# Patient Record
Sex: Female | Born: 1950
Health system: Southern US, Community
[De-identification: ages and names within clinical notes are randomized; demographics above are authoritative.]

## PROBLEM LIST (undated history)

## (undated) DIAGNOSIS — J309 Allergic rhinitis, unspecified: Secondary | ICD-10-CM

## (undated) DIAGNOSIS — J45909 Unspecified asthma, uncomplicated: Secondary | ICD-10-CM

## (undated) DIAGNOSIS — I10 Essential (primary) hypertension: Secondary | ICD-10-CM

## (undated) DIAGNOSIS — E119 Type 2 diabetes mellitus without complications: Secondary | ICD-10-CM

## (undated) DIAGNOSIS — M48061 Spinal stenosis, lumbar region without neurogenic claudication: Secondary | ICD-10-CM

## (undated) DIAGNOSIS — D649 Anemia, unspecified: Secondary | ICD-10-CM

## (undated) DIAGNOSIS — R51 Headache: Secondary | ICD-10-CM

## (undated) DIAGNOSIS — E785 Hyperlipidemia, unspecified: Secondary | ICD-10-CM

## (undated) DIAGNOSIS — D496 Neoplasm of unspecified behavior of brain: Secondary | ICD-10-CM

## (undated) DIAGNOSIS — R112 Nausea with vomiting, unspecified: Secondary | ICD-10-CM

## (undated) DIAGNOSIS — IMO0001 Reserved for inherently not codable concepts without codable children: Secondary | ICD-10-CM

## (undated) DIAGNOSIS — R011 Cardiac murmur, unspecified: Secondary | ICD-10-CM

## (undated) DIAGNOSIS — M1990 Unspecified osteoarthritis, unspecified site: Secondary | ICD-10-CM

## (undated) DIAGNOSIS — K2970 Gastritis, unspecified, without bleeding: Secondary | ICD-10-CM

## (undated) DIAGNOSIS — G039 Meningitis, unspecified: Secondary | ICD-10-CM

## (undated) DIAGNOSIS — K219 Gastro-esophageal reflux disease without esophagitis: Secondary | ICD-10-CM

## (undated) DIAGNOSIS — J40 Bronchitis, not specified as acute or chronic: Secondary | ICD-10-CM

## (undated) DIAGNOSIS — I341 Nonrheumatic mitral (valve) prolapse: Secondary | ICD-10-CM

## (undated) DIAGNOSIS — N3281 Overactive bladder: Secondary | ICD-10-CM

## (undated) DIAGNOSIS — H269 Unspecified cataract: Secondary | ICD-10-CM

## (undated) DIAGNOSIS — Z9889 Other specified postprocedural states: Secondary | ICD-10-CM

## (undated) DIAGNOSIS — T7840XA Allergy, unspecified, initial encounter: Secondary | ICD-10-CM

## (undated) DIAGNOSIS — M8580 Other specified disorders of bone density and structure, unspecified site: Secondary | ICD-10-CM

## (undated) DIAGNOSIS — S0990XA Unspecified injury of head, initial encounter: Secondary | ICD-10-CM

## (undated) HISTORY — DX: Anemia, unspecified: D64.9

## (undated) HISTORY — PX: LYMPH NODE DISSECTION: SHX5087

## (undated) HISTORY — DX: Allergic rhinitis, unspecified: J30.9

## (undated) HISTORY — DX: Meningitis, unspecified: G03.9

## (undated) HISTORY — DX: Allergy, unspecified, initial encounter: T78.40XA

## (undated) HISTORY — PX: UPPER GASTROINTESTINAL ENDOSCOPY: SHX188

## (undated) HISTORY — DX: Other specified disorders of bone density and structure, unspecified site: M85.80

## (undated) HISTORY — DX: Unspecified cataract: H26.9

## (undated) HISTORY — DX: Unspecified osteoarthritis, unspecified site: M19.90

## (undated) HISTORY — PX: INSERT INTRAPERITONEAL CATHETER: SUR716

## (undated) HISTORY — DX: Unspecified asthma, uncomplicated: J45.909

## (undated) HISTORY — DX: Overactive bladder: N32.81

---
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
## 1974-02-08 HISTORY — PX: APPENDECTOMY: SHX54

## 1976-02-09 HISTORY — PX: ANKLE SURGERY: SHX546

## 1982-02-08 HISTORY — PX: LUMBAR PERITONEAL SHUNT: SHX1984

## 1983-02-09 HISTORY — PX: MYOMECTOMY ABDOMINAL APPROACH: SUR870

## 1987-02-09 HISTORY — PX: ABDOMINAL HYSTERECTOMY: SHX81

## 1990-02-08 HISTORY — PX: SYNOVECTOMY: SHX133

## 1991-02-09 DIAGNOSIS — G039 Meningitis, unspecified: Secondary | ICD-10-CM

## 1991-02-09 HISTORY — DX: Meningitis, unspecified: G03.9

## 1993-02-08 HISTORY — PX: VEIN LIGATION AND STRIPPING: SHX2653

## 1994-02-08 HISTORY — PX: OTHER SURGICAL HISTORY: SHX169

## 1998-02-08 HISTORY — PX: OTHER SURGICAL HISTORY: SHX169

## 1999-09-22 ENCOUNTER — Encounter: Payer: Self-pay | Admitting: Neurosurgery

## 1999-09-22 ENCOUNTER — Ambulatory Visit (HOSPITAL_COMMUNITY): Admission: RE | Admit: 1999-09-22 | Discharge: 1999-09-22 | Payer: Self-pay | Admitting: Neurosurgery

## 1999-10-07 ENCOUNTER — Encounter: Payer: Self-pay | Admitting: Neurosurgery

## 1999-10-07 ENCOUNTER — Encounter: Admission: RE | Admit: 1999-10-07 | Discharge: 1999-10-07 | Payer: Self-pay | Admitting: Neurosurgery

## 1999-11-11 ENCOUNTER — Encounter: Admission: RE | Admit: 1999-11-11 | Discharge: 1999-11-11 | Payer: Self-pay | Admitting: Neurosurgery

## 1999-11-11 ENCOUNTER — Encounter: Payer: Self-pay | Admitting: Neurosurgery

## 1999-11-23 ENCOUNTER — Ambulatory Visit (HOSPITAL_COMMUNITY): Admission: RE | Admit: 1999-11-23 | Discharge: 1999-11-23 | Payer: Self-pay | Admitting: Neurosurgery

## 1999-11-23 ENCOUNTER — Encounter: Payer: Self-pay | Admitting: Neurosurgery

## 1999-11-26 ENCOUNTER — Encounter: Payer: Self-pay | Admitting: Neurology

## 1999-11-26 ENCOUNTER — Ambulatory Visit (HOSPITAL_COMMUNITY): Admission: RE | Admit: 1999-11-26 | Discharge: 1999-11-26 | Payer: Self-pay | Admitting: Neurology

## 1999-12-16 ENCOUNTER — Encounter: Payer: Self-pay | Admitting: Neurosurgery

## 1999-12-18 ENCOUNTER — Inpatient Hospital Stay (HOSPITAL_COMMUNITY): Admission: RE | Admit: 1999-12-18 | Discharge: 1999-12-20 | Payer: Self-pay | Admitting: Neurosurgery

## 1999-12-18 ENCOUNTER — Encounter: Payer: Self-pay | Admitting: Neurosurgery

## 2003-04-17 ENCOUNTER — Other Ambulatory Visit: Admission: RE | Admit: 2003-04-17 | Discharge: 2003-04-17 | Payer: Self-pay | Admitting: Family Medicine

## 2004-02-09 HISTORY — PX: COLONOSCOPY: SHX174

## 2004-07-02 ENCOUNTER — Ambulatory Visit: Payer: Self-pay | Admitting: Internal Medicine

## 2004-08-20 ENCOUNTER — Ambulatory Visit: Payer: Self-pay | Admitting: Internal Medicine

## 2004-09-03 ENCOUNTER — Encounter (INDEPENDENT_AMBULATORY_CARE_PROVIDER_SITE_OTHER): Payer: Self-pay | Admitting: Specialist

## 2004-09-03 ENCOUNTER — Ambulatory Visit: Payer: Self-pay | Admitting: Internal Medicine

## 2005-08-17 ENCOUNTER — Other Ambulatory Visit: Admission: RE | Admit: 2005-08-17 | Discharge: 2005-08-17 | Payer: Self-pay | Admitting: Family Medicine

## 2006-02-08 HISTORY — PX: CERVICAL SPINE SURGERY: SHX589

## 2006-04-04 ENCOUNTER — Encounter: Admission: RE | Admit: 2006-04-04 | Discharge: 2006-04-04 | Payer: Self-pay | Admitting: Neurosurgery

## 2006-04-28 ENCOUNTER — Encounter: Admission: RE | Admit: 2006-04-28 | Discharge: 2006-04-28 | Payer: Self-pay | Admitting: Neurosurgery

## 2006-05-03 ENCOUNTER — Encounter: Payer: Self-pay | Admitting: Internal Medicine

## 2006-05-03 ENCOUNTER — Observation Stay (HOSPITAL_COMMUNITY): Admission: RE | Admit: 2006-05-03 | Discharge: 2006-05-04 | Payer: Self-pay | Admitting: Neurosurgery

## 2007-06-21 ENCOUNTER — Encounter: Payer: Self-pay | Admitting: Internal Medicine

## 2007-09-12 ENCOUNTER — Encounter: Payer: Self-pay | Admitting: Internal Medicine

## 2007-09-19 ENCOUNTER — Encounter: Payer: Self-pay | Admitting: Internal Medicine

## 2007-10-06 ENCOUNTER — Encounter: Admission: RE | Admit: 2007-10-06 | Discharge: 2007-11-08 | Payer: Self-pay | Admitting: Otolaryngology

## 2007-10-23 ENCOUNTER — Encounter: Payer: Self-pay | Admitting: Internal Medicine

## 2007-11-08 ENCOUNTER — Encounter: Payer: Self-pay | Admitting: Internal Medicine

## 2007-12-06 ENCOUNTER — Encounter: Payer: Self-pay | Admitting: Internal Medicine

## 2007-12-11 ENCOUNTER — Ambulatory Visit: Payer: Self-pay | Admitting: Internal Medicine

## 2007-12-11 DIAGNOSIS — R498 Other voice and resonance disorders: Secondary | ICD-10-CM | POA: Insufficient documentation

## 2007-12-11 DIAGNOSIS — R1319 Other dysphagia: Secondary | ICD-10-CM | POA: Insufficient documentation

## 2007-12-14 ENCOUNTER — Ambulatory Visit: Admission: RE | Admit: 2007-12-14 | Discharge: 2007-12-14 | Payer: Self-pay | Admitting: Internal Medicine

## 2007-12-20 ENCOUNTER — Other Ambulatory Visit: Admission: RE | Admit: 2007-12-20 | Discharge: 2007-12-20 | Payer: Self-pay | Admitting: Family Medicine

## 2007-12-26 ENCOUNTER — Telehealth: Payer: Self-pay | Admitting: Internal Medicine

## 2007-12-27 ENCOUNTER — Encounter: Payer: Self-pay | Admitting: Internal Medicine

## 2008-01-22 ENCOUNTER — Encounter: Admission: RE | Admit: 2008-01-22 | Discharge: 2008-01-22 | Payer: Self-pay | Admitting: Family Medicine

## 2009-03-21 ENCOUNTER — Encounter: Admission: RE | Admit: 2009-03-21 | Discharge: 2009-03-21 | Payer: Self-pay | Admitting: Surgery

## 2010-03-01 ENCOUNTER — Encounter: Payer: Self-pay | Admitting: Family Medicine

## 2010-05-12 ENCOUNTER — Other Ambulatory Visit: Payer: Self-pay | Admitting: Gastroenterology

## 2010-05-15 ENCOUNTER — Ambulatory Visit
Admission: RE | Admit: 2010-05-15 | Discharge: 2010-05-15 | Disposition: A | Discharge status: Home / Self Care | Payer: BC Managed Care – PPO | Source: Ambulatory Visit | Attending: Gastroenterology | Admitting: Gastroenterology

## 2010-05-27 ENCOUNTER — Other Ambulatory Visit: Payer: Self-pay | Admitting: Gastroenterology

## 2010-06-05 ENCOUNTER — Other Ambulatory Visit (HOSPITAL_COMMUNITY): Payer: Self-pay | Admitting: Gastroenterology

## 2010-06-05 DIAGNOSIS — R1084 Generalized abdominal pain: Secondary | ICD-10-CM

## 2010-06-15 ENCOUNTER — Encounter (HOSPITAL_COMMUNITY)
Admission: RE | Admit: 2010-06-15 | Discharge: 2010-06-15 | Disposition: A | Discharge status: Home / Self Care | Payer: BC Managed Care – PPO | Source: Ambulatory Visit | Attending: Gastroenterology | Admitting: Gastroenterology

## 2010-06-15 DIAGNOSIS — R109 Unspecified abdominal pain: Secondary | ICD-10-CM | POA: Insufficient documentation

## 2010-06-15 DIAGNOSIS — R1084 Generalized abdominal pain: Secondary | ICD-10-CM

## 2010-06-15 MED ORDER — TECHNETIUM TC 99M MEBROFENIN IV KIT
5.0000 | PACK | Freq: Once | INTRAVENOUS | Status: AC | PRN
  Administered 2010-06-15: 5 via INTRAVENOUS

## 2010-06-26 NOTE — H&P (Signed)
[place]. [facility]  Patient:    Laurie Burch, Laurie Burch                      MRN: [id number] Adm. Date:  [id number] Attending:  Josie Saunders                         History and Physical  REASON FOR ADMISSION:  Pseudotumor cerebri.  HISTORY OF ILLNESS:  Justiss Gerbino is an established patient of Dr. Jeannett Senior C. Roxan Hockey who had a ______ lumboperitoneal shunt placed in 1984 for the management of pseudotumor cerebri.  I initially saw her in the office on September 23, 1999.  She had papilledema and underwent serial lumbar punctures prior to that surgical intervention.  Since that time, she actually has done extremely well and had no complaints of headache, but the Thursday before she visited with me, she was thrown from a horse and fell on her back.  She was bitten by fire ants and then complained of pain in her low back.  She said she had sensation of pressure-type headaches, which she said started the next day, and since that time, she has had pressure in her head.  She says her back hurts worse than her headache, but her headache is concerning to her.  She said her headache is worse when she lies flat.  She felt severe pressure the day before she visited with me and complains of very occasional blurring of her vision.  Previously, when she had pseudotumor, she had a lot of problems with her vision.  She says that the headaches do not appear as sharp as they were previously but she says she has a hard time remembering how she felt 17 years ago.  She says the headaches go away when she is up.  She denies any abdominal pain.  Ms. Hillenburg underwent a head CT which showed no significant abnormality.  She had a lumbar AP and lateral x-ray which demonstrates a T12 compression fracture (she has six lumbar vertebrae), without significant retropulsion of bone and without significant loss of vertebral body height.  The shunt appeared to be intact and in position on  initial x-ray series.  REVIEW OF SYSTEMS:  Detailed review of systems sheet was reviewed with the patient; pertinent positives are: CONSTITUTIONAL:  She notes fever.  EYES: She notes eye injuries.  EARS, NOSE, MOUTH AND THROAT:  She notes balance disturbance.  CARDIOVASCULAR:  She notes high blood pressure. GASTROINTESTINAL:  She notes nausea.  MUSCULOSKELETAL:  She notes previous broken bones, leg pain and neck pain.  NEUROLOGIC:  She has problems with double and blurred vision and pseudotumor.  ENDOCRINE:  She notes diabetes. HEMATOLOGIC/LYMPHATICS:  She notes persistent swollen glands and/or lymph nodes.  All other review of systems negative.  PAST MEDICAL HISTORY:  Current medical conditions significant for hypertension and pseudotumor cerebri and type 2 diabetes.  PRIOR OPERATIONS AND HOSPITALIZATIONS:  She had a tonsillectomy and adenoidectomy in 1968, appendectomy in 1976, trimalleolar fracture of the ankle in 1978, C-section in 1981, lumboperitoneal shunt in 1984, myomectomy of the abdomen in 1985, hysterectomy in 1989, biopsy of parotid gland in 1990, synovectomy of flexor tendon in 1992, mass removed from her arm in 1994, varicose vein removed from her right leg in 1995, arthroscopic surgery of her knee in 1996.  ALLERGIES:  She has no known drug allergies.  MEDICATIONS:  She has been taking Vicodin for headache and  back pain.  HEIGHT AND WEIGHT:  She is 5-feet 4-inches tall, 210 pounds.  SOCIAL HISTORY:  She is a nonsmoker and nondrinker of alcoholic beverages.  No history of substance abuse.  DIAGNOSTIC STUDIES:  As above.  PHYSICAL EXAMINATION  GENERAL:  On initial examination, Ms. Duffey is awake and conversant.  She speaks with clear and fluent speech.  She has pain to palpation in her upper lumbar spine; otherwise, no significant pain.  She has no discomfort over her abdomen.  NEUROLOGIC:  Her initial neurologic examination demonstrated that she was oriented  to time, person and place, has good recall of both recent and remote memory, with normal attention span and concentration.  The patient speaks with clear and fluent speech and exhibits normal language function and appropriate fund of knowledge.  Cranial nerve examination:  Pupils are equal, round and reactive to light.  Extraocular movements are full.  Visual fields are full to confrontational testing.  Facial sensation and facial motor movement are symmetric and intact.  Hearing is intact to finger rub.  Palate is upgoing. Shoulder shrug is symmetric.  Tongue protrudes in the midline.  No papilledema is identified.  She does have anisocoria, right pupil slightly larger than the left.  Spontaneous venous pulsations are identified.  Motor examination: Motor strength is 5/5 in bilateral deltoids, biceps, triceps, hand grips, wrist extensors and interossei.  In the lower extremities, motor strength is 5/5 in hip flexion and extension, quadriceps, hamstrings, plantarflexion, dorsiflexion and extensor hallucis longi.  Sensory examination normal to light touch and pinprick sensation in the upper and lower extremities.  Deep tendon reflexes are 2 in the upper extremities bilaterally, in the biceps, triceps and brachioradialis; 2 at the knees, 2 at the ankles and great toes are downgoing to plantar stimulation.  No pathologic reflexes.  Cerebellar examination:  Normal coordination in the upper and lower extremities, normal rapid alternating movements.  Romberg test was negative.  ASSESSMENT:  Initially, I treated Ms. Egolf for her T12 compression fracture and her back pain gradually resolved; however, her headaches continued to be problematic for her.  I sent her to Dr. Candace Cruise, who had previously evaluated her for papilledema, and he said that there was no evidence of papilledema.  She subsequently went to see Dr. Genene Churn. Love, who had previously seen her and managed her for her  pseudotumor prior to the shunt placement, and he evaluated her headaches and then did a lumbar puncture and the lumbar puncture demonstrated a significantly elevated opening pressure.  It was felt at that point that her shunt was not working and it was therefore elected for her to be admitted to the hospital for lumboperitoneal shunt revision.  Patient understood the risks and benefits of surgery and wished to proceed with surgery and was admitted on a same-day-as-procedure basis on December 18, 1999 for lumboperitoneal shunt revision. DD:  12/18/99 TD:  12/18/99 Job: [id number]

## 2010-06-26 NOTE — Op Note (Signed)
Laurie Burch, Laurie Burch               ACCOUNT NO.:  [id number]   MEDICAL RECORD NO.:  [id number]          PATIENT TYPE:  INP   LOCATION:  2899                         FACILITY:  [facility]   PHYSICIAN:  Danae Orleans. Venetia Maxon, M.D.  DATE OF BIRTH:  09/26/1950   DATE OF PROCEDURE:  05/03/2006  DATE OF DISCHARGE:                               OPERATIVE REPORT   PREOPERATIVE DIAGNOSIS:  Herniated cervical disc with cervical  spondylosis, degenerative disc disease, and radiculopathy C5-C6 and C6-  C7 levels.   POSTOPERATIVE DIAGNOSIS:  Herniated cervical disc with cervical  spondylosis, degenerative disc disease, and radiculopathy C5-C6 and C6-  C7 levels.   PROCEDURE:  Anterior cervical decompression and fusion C5-C6 and C6-C7  levels, PEEK interbody cages, Osteocel autograft, and anterior cervical  plate.   SURGEON:  Danae Orleans. Venetia Maxon, M.D.   ASSISTANT:  Hewitt Shorts, M.D.  Georgiann Cocker, RN   ANESTHESIA:  General endotracheal anesthesia.   ESTIMATED BLOOD LOSS:  Minimal.   COMPLICATIONS:  None.   DISPOSITION:  Recovery.   INDICATIONS:  Laurie Burch is a 60 year old woman with significant  cervical spondylosis and foraminal stenosis at C5-C6 on the right  causing right C6 radiculopathy and also a cervical disc herniation at C6-  C7 on the left.  It was elected to take her to surgery for anterior  cervical decompression and fusion at these affected levels.   DESCRIPTION OF PROCEDURE:  Laurie Burch was brought to the operating  room.  Following satisfactory uncomplicated induction of general  endotracheal anesthesia and placement of intravenous lines, the patient  was placed in the supine position on the operating table.  She was  placed in 10 pounds of halter traction.  Her anterior neck was then  prepped and draped in the usual sterile fashion.  The area of planned  incision was infiltrated with 0.25% Marcaine and 0.5% lidocaine with  1:200,000 epinephrine.  An incision was made in  the midline at the  anterior border of the sternocleidomastoid muscle and carried sharply  through platysma layer.  Subplatysmal dissection was performed exposing  the anterior border of the sternocleidomastoid muscle. Using blunt  dissection, the carotid sheath was kept lateral, the trachea and  esophagus kept medial, exposing the anterior cervical spine.   Multiple localizing x-rays were obtained but because of the patient's  extremely large body habitus, it was very difficult to visualize the  correct level.  Initially, spinal needles were placed in what was felt  to be the C5-C6 and C4-C5 level, however, it was not possible to  visualize this level. On the third intraoperative x-ray, a marker needle  was placed at what was felt to be C3-C4 and this was confirmed on  intraoperative x-ray.  The needles were placed at C3-C4, C4-C5, and C5-  C6 levels.  Because of the difficulty visualizing below C4, it was felt  that no further x-rays intraoperatively would be very useful and no  further x-rays were obtained.   The longus colli muscles were then taken down from the anterior cervical  spine from C5 to C7 levels bilaterally using  electrocautery and Investment banker, corporate.  A self-retaining Shadowline retractor was placed to  facilitate exposure along with up and down retractor.  Because of the  patient's extremely large size, it was elected to use distraction pins  and these were initially placed at the C6 and C7 level.  The interspace  was then incised and disc material was removed in a piecemeal fashion.  The endplates were decorticated using the high speed drill. The bone  autograft was preserved for later use in bone grafting.  Under loupe  magnification, the central spinal cord dura and both C7 nerve roots were  widely decompressed.  Subsequently, at the C5-C6 level, a similar  decompression was performed. Under the operating microscope, the right  C6 nerve root was widely decompressed as it  extended out the neural  foramen. Both nerve roots and the central spinal cord dura were well  decompressed. After trial sizing, a 6 mm PEEK interbody cage was  selected, packed with morcellized bone autograft and Osteocel, and  countersunk appropriately.  Attention was then turned to the C6-C7 level  where a 7 mm interbody cage was also selected, packed with Osteocel,  autograft, and inserted in the interspace and countersunk appropriately.   A 32-mm tressel anterior cervical plate was then affixed to the anterior  cervical spine using variable angle 14 mm screws, two at C5, two at C6,  two at C7.  All screws had excellent purchase.  The traction weight was  removed prior to placing the plate.  The soft tissues were inspected and  found to be in good repair.  Hemostasis was assured.  The wound was  irrigated with bacitracin saline.  The platysma layer was then closed  with 0 Vicryl sutures.  The skin edges were approximated with 3-0 Vicryl  subcuticular stitch.  The wound was dressed with Dermabond.  The patient  was extubated in the operating room and taken to the recovery room in  stable satisfactory condition having tolerated the operation well.  Counts were correct at the end of the case.      Danae Orleans. Venetia Maxon, M.D.  Electronically Signed     JDS/MEDQ  D:  05/03/2006  T:  05/03/2006  Job:  [id number]

## 2010-06-26 NOTE — Op Note (Signed)
[place]. [facility]  Patient:    Laurie Burch, Laurie Burch                      MRN: [id number] Proc. Date: 12/18/99 Adm. Date:  [id number] Attending:  Josie Saunders                           Operative Report  PREOPERATIVE DIAGNOSIS:  Pseudotumor cerebri.  POSTOPERATIVE DIAGNOSIS:  Pseudotumor cerebri.  PROCEDURE:  Lumboperitoneal shunt revision.  SURGEON:  Danae Orleans. Venetia Maxon, M.D.  ASSISTANTMarland Kitchen  Mena Goes. Franky Macho, M.D.  ANESTHESIA:  General endotracheal anesthesia.  ESTIMATED BLOOD LOSS:  Minimal.  COMPLICATIONS:  None.  DISPOSITION:  Recovery.  INDICATION:  [name] is a 60 year old woman who previously underwent placement of lumboperitoneal shunt for a pseudotumor cerebri by Dr. Jonne Ply in 1984.  She did well until this summer, when she fell off a horse and broke her T12 vertebra.  She was managed nonoperatively for her T12 compression fracture but complained of severe headaches ever since the accident, and she was evaluated by ophthalmologist, who did not see evidence of papilledema.  She saw Dr. Sandria Manly, who had a lumbar puncture performed which demonstrated an opening pressure of 300 mmH2O, and it was felt that the lumboperitoneal shunt may have been dislodged in the accident and was not working.  It was therefore elected to admit her for LP shunt revision.  DESCRIPTION OF PROCEDURE:  Ms. Jaster was brought to the operating room. Following satisfactory uncomplicated introduction of general endotracheal anesthesia, she was placed in a left lateral position with the right side elevated.  An axillary roll was used.  Her soft tissue and bony prominences were padded appropriately.  Her low back and right flank and abdomen were then prepped and draped in the usual sterile fashion.  A localizing x-ray was performed to attempt to identify the position of the shunt tubing prior to making an incision.  An incision was made at the lower extent of the  previous lumbar incision and was carried through copious adipose tissue to the lumbodorsal fascia, where using careful dissection, the previous shunt tubing was identified and was cleared of investing soft tissue and scar.  It was felt on review x-ray that the shunt tubing was not in the spinal canal, and the shunt tubing was withdrawn.  This revealed a T-tube.  Upon removal of the tubing, there was no flow of spinal fluid, reinforcing the hypothesis that the shunt was not functioning.  A Tuohy needle was then inserted into the spinal canal at the L2-3 interspace with good, brisk return of CSF under high pressure.  The Medtronic _____ lumboperitoneal shunt was then inserted into the spinal canal and the Tuohy needle was removed.  There was good, brisk flow of spinal fluid through the catheter distal foot valves.  It was elected to splice the new tubing into the old abdominal catheter, as it was felt that this was almost certainly still functioning.  An effort was made to perform manometry to confirm that it was functioning, but it was not possible to get a good seal on the tubing.  A stepdown connector was utilized to connect the larger-bore abdominal portion to the [facility] lumbar portion, and the spliced ends were anchored to the connector with 2-0 silk free ties.  The tubing was then anchored with a wing anchor and a 2-0 silk stitch to the lumbodorsal fascia.  It appeared that the tubing had a gentle curve.  There was no evidence of kinking of the catheter, and it was felt that the tubing was in good position.  The wound was then copiously irrigated with bacitracin and saline.  The subcutaneous fat and connective tissue and subcuticular layer were reapproximated with 2-0 Vicryl interrupted inverted stitches.  The skin edges were reapproximated with interrupted 3-0 Vicryl subcuticular stitch, and the skin edges were then reapproximated with 3-0 nylon running locked stitch. The wound  was dressed with bacitracin and Telfa gauze and tape.  The patient was extubated in the operating room and taken to the recovery room in stable and satisfactory condition, having tolerated the operation well.  Counts were correct at the end of the case. DD:  12/18/99 TD:  12/19/99 Job: [id number]

## 2012-04-20 ENCOUNTER — Other Ambulatory Visit: Payer: Self-pay | Admitting: Family Medicine

## 2012-04-21 ENCOUNTER — Ambulatory Visit
Admission: RE | Admit: 2012-04-21 | Discharge: 2012-04-21 | Disposition: A | Discharge status: Home / Self Care | Payer: BC Managed Care – PPO | Source: Ambulatory Visit | Attending: Family Medicine | Admitting: Family Medicine

## 2012-04-21 DIAGNOSIS — R1011 Right upper quadrant pain: Secondary | ICD-10-CM

## 2013-02-08 HISTORY — PX: ANTERIOR CRUCIATE LIGAMENT REPAIR: SHX115

## 2013-03-15 ENCOUNTER — Telehealth: Payer: Self-pay | Admitting: Cardiology

## 2013-03-15 NOTE — Telephone Encounter (Signed)
New Message  Pt states that she has recently switched orthodontists and she is requesting to have a letter sent to their office indicating she does not have to have pre-antibiotics before appointments.. Please call back to [facility] Fax# [phone]

## 2013-03-15 NOTE — Telephone Encounter (Signed)
Called and informed patient that Dr. Marlou Porch and his medical assistant will be in the office tomorrow and that I would forward her request to them. Pt has appointment coming up in March.

## 2013-03-19 NOTE — Telephone Encounter (Signed)
Laurie Burch does not need dental antibiotic prophylaxis.

## 2013-03-20 ENCOUNTER — Encounter: Payer: Self-pay | Admitting: Cardiology

## 2013-03-20 NOTE — Telephone Encounter (Signed)
Letter generated and faxed to [facility] [employer]

## 2013-06-01 NOTE — Pre-Procedure Instructions (Signed)
JONEA BUKOWSKI  06/01/2013   Your procedure is scheduled on:  Thursday June 07, 2013 at 12:35 PM.  Report to [place] Stay Entrance "A"  Admitting at 10:35 AM.  Call this number if you have problems the morning of surgery: [phone]  Please call Pharmacy Tech to update your medications: [phone]  Call [facility] after you call pharmacy tech: [phone]   Remember:   Do not eat food or drink liquids after midnight.   Take these medicines the morning of surgery with A SIP OF WATER: Nexium, Hydrocodone if needed pain, and Bisoprolol (Ziac)   Do NOT take any diabetic medications the morning of your surgery   Do not wear jewelry, make-up or nail polish.  Do not wear lotions, powders, or perfumes. You may wear deodorant.  Do not shave 48 hours prior to surgery.   Do not bring valuables to the hospital.  [facility] is not responsible for any belongings or valuables.               Contacts, dentures or bridgework may not be worn into surgery.  Leave suitcase in the car. After surgery it may be brought to your room.  For patients admitted to the hospital, discharge time is determined by your treatment team.               Patients discharged the day of surgery will not be allowed to drive home.  Name and phone number of your driver: Family/Friend  Special Instructions: Shower using CHG soap the night before and the morning of your surgery   Please read over the following fact sheets that you were given: Pain Booklet, Coughing and Deep Breathing, MRSA Information and Surgical Site Infection Prevention

## 2013-06-04 ENCOUNTER — Encounter (HOSPITAL_COMMUNITY): Payer: Self-pay

## 2013-06-04 ENCOUNTER — Encounter (HOSPITAL_COMMUNITY)
Admission: RE | Admit: 2013-06-04 | Discharge: 2013-06-04 | Disposition: A | Discharge status: Home / Self Care | Payer: BC Managed Care – PPO | Source: Ambulatory Visit | Attending: Orthopedic Surgery | Admitting: Orthopedic Surgery

## 2013-06-04 ENCOUNTER — Encounter (HOSPITAL_COMMUNITY)
Admission: RE | Admit: 2013-06-04 | Discharge: 2013-06-04 | Disposition: A | Discharge status: Home / Self Care | Payer: BC Managed Care – PPO | Source: Ambulatory Visit | Attending: Anesthesiology | Admitting: Anesthesiology

## 2013-06-04 ENCOUNTER — Encounter (HOSPITAL_COMMUNITY): Payer: Self-pay | Admitting: Pharmacy Technician

## 2013-06-04 DIAGNOSIS — Z01818 Encounter for other preprocedural examination: Secondary | ICD-10-CM | POA: Diagnosis not present

## 2013-06-04 DIAGNOSIS — Z01810 Encounter for preprocedural cardiovascular examination: Secondary | ICD-10-CM | POA: Diagnosis not present

## 2013-06-04 DIAGNOSIS — Z01812 Encounter for preprocedural laboratory examination: Secondary | ICD-10-CM | POA: Insufficient documentation

## 2013-06-04 HISTORY — DX: Other specified postprocedural states: Z98.890

## 2013-06-04 HISTORY — DX: Gastritis, unspecified, without bleeding: K29.70

## 2013-06-04 HISTORY — DX: Type 2 diabetes mellitus without complications: E11.9

## 2013-06-04 HISTORY — DX: Headache: R51

## 2013-06-04 HISTORY — DX: Hyperlipidemia, unspecified: E78.5

## 2013-06-04 HISTORY — DX: Bronchitis, not specified as acute or chronic: J40

## 2013-06-04 HISTORY — DX: Essential (primary) hypertension: I10

## 2013-06-04 HISTORY — DX: Unspecified osteoarthritis, unspecified site: M19.90

## 2013-06-04 HISTORY — DX: Neoplasm of unspecified behavior of brain: D49.6

## 2013-06-04 HISTORY — DX: Nonrheumatic mitral (valve) prolapse: I34.1

## 2013-06-04 HISTORY — DX: Nausea with vomiting, unspecified: R11.2

## 2013-06-04 LAB — CBC
HCT: 38.6 % (ref 36.0–46.0)
HEMOGLOBIN: 13.1 g/dL (ref 12.0–15.0)
MCH: 29.8 pg (ref 26.0–34.0)
MCHC: 33.9 g/dL (ref 30.0–36.0)
MCV: 87.7 fL (ref 78.0–100.0)
PLATELETS: 275 10*3/uL (ref 150–400)
RBC: 4.4 MIL/uL (ref 3.87–5.11)
RDW: 13.2 % (ref 11.5–15.5)
WBC: 9.4 10*3/uL (ref 4.0–10.5)

## 2013-06-04 LAB — BASIC METABOLIC PANEL
BUN: 15 mg/dL (ref 6–23)
CALCIUM: 9.6 mg/dL (ref 8.4–10.5)
CO2: 27 meq/L (ref 19–32)
Chloride: 96 mEq/L (ref 96–112)
Creatinine, Ser: 0.84 mg/dL (ref 0.50–1.10)
GFR calc Af Amer: 84 mL/min — ABNORMAL LOW (ref >90)
GFR, EST NON AFRICAN AMERICAN: 72 mL/min — AB (ref >90)
GLUCOSE: 107 mg/dL — AB (ref 70–99)
POTASSIUM: 3.9 meq/L (ref 3.7–5.3)
SODIUM: 138 meq/L (ref 137–147)

## 2013-06-04 LAB — SURGICAL PCR SCREEN
MRSA, PCR: NEGATIVE
STAPHYLOCOCCUS AUREUS: NEGATIVE

## 2013-06-04 NOTE — Progress Notes (Signed)
06/04/13 1338  OBSTRUCTIVE SLEEP APNEA  Have you ever been diagnosed with sleep apnea through a sleep study? No  Do you snore loudly (loud enough to be heard through closed doors)?  0  Do you often feel tired, fatigued, or sleepy during the daytime? 0  Has anyone observed you stop breathing during your sleep? 0  Do you have, or are you being treated for high blood pressure? 1  BMI more than 35 kg/m2? 1  Age over 63 years old? 1  Neck circumference greater than 40 cm/16 inches? 1  Gender: 0  Obstructive Sleep Apnea Score 4  Score 4 or greater  Results sent to PCP   This patient has screened at risk for sleep apnea using the STOP Bang tool used during a pre-surgical visit. A score of 4 or greater is at risk for sleep apnea.

## 2013-06-04 NOTE — Progress Notes (Signed)
PCP is Shirline Frees at [facility]. Patient informed Nurse that she had a stress test approximately a year ago with Dr. Candee Furbish. Will request records. Patient denied having a cardiac cath or sleep study. Patient also denied having any cardiac or pulmonary issues. Patient did not have any medications in EPIC and patient stated she forgot her medication list at work. Nurse provided patient with the Pharmacy Tech number and instructed patient to call Tech so medication list could be updated, and Nurse also provided patient with my direct call back number so Nurse could give proper instructions on what medications to take DOS. Patient verbalized understanding.

## 2013-06-06 MED ORDER — CEFAZOLIN SODIUM-DEXTROSE 2-3 GM-% IV SOLR
2.0000 g | INTRAVENOUS | Status: AC
  Administered 2013-06-07: 2 g via INTRAVENOUS
  Filled 2013-06-06: qty 50

## 2013-06-07 ENCOUNTER — Encounter (HOSPITAL_COMMUNITY)
Admission: RE | Disposition: A | Discharge status: Home / Self Care | Source: Ambulatory Visit | Attending: Orthopedic Surgery

## 2013-06-07 ENCOUNTER — Encounter (HOSPITAL_COMMUNITY): Payer: BC Managed Care – PPO | Admitting: Certified Registered Nurse Anesthetist

## 2013-06-07 ENCOUNTER — Encounter (HOSPITAL_COMMUNITY): Payer: Self-pay | Admitting: Certified Registered Nurse Anesthetist

## 2013-06-07 ENCOUNTER — Inpatient Hospital Stay (HOSPITAL_COMMUNITY): Payer: BC Managed Care – PPO

## 2013-06-07 ENCOUNTER — Inpatient Hospital Stay (HOSPITAL_COMMUNITY)
Admission: RE | Admit: 2013-06-07 | Discharge: 2013-06-08 | DRG: 473 | Disposition: A | Discharge status: Home / Self Care | Payer: BC Managed Care – PPO | Source: Ambulatory Visit | Attending: Orthopedic Surgery | Admitting: Orthopedic Surgery

## 2013-06-07 ENCOUNTER — Inpatient Hospital Stay (HOSPITAL_COMMUNITY): Payer: BC Managed Care – PPO | Admitting: Certified Registered Nurse Anesthetist

## 2013-06-07 DIAGNOSIS — M4712 Other spondylosis with myelopathy, cervical region: Principal | ICD-10-CM | POA: Diagnosis present

## 2013-06-07 DIAGNOSIS — R262 Difficulty in walking, not elsewhere classified: Secondary | ICD-10-CM | POA: Diagnosis present

## 2013-06-07 DIAGNOSIS — R269 Unspecified abnormalities of gait and mobility: Secondary | ICD-10-CM | POA: Diagnosis present

## 2013-06-07 DIAGNOSIS — Z981 Arthrodesis status: Secondary | ICD-10-CM

## 2013-06-07 DIAGNOSIS — J3800 Paralysis of vocal cords and larynx, unspecified: Secondary | ICD-10-CM | POA: Diagnosis present

## 2013-06-07 DIAGNOSIS — R490 Dysphonia: Secondary | ICD-10-CM | POA: Diagnosis present

## 2013-06-07 DIAGNOSIS — R4789 Other speech disturbances: Secondary | ICD-10-CM | POA: Diagnosis present

## 2013-06-07 DIAGNOSIS — M542 Cervicalgia: Secondary | ICD-10-CM | POA: Diagnosis present

## 2013-06-07 HISTORY — PX: ANTERIOR CERVICAL DECOMP/DISCECTOMY FUSION: SHX1161

## 2013-06-07 LAB — GLUCOSE, CAPILLARY
GLUCOSE-CAPILLARY: 109 mg/dL — AB (ref 70–99)
Glucose-Capillary: 130 mg/dL — ABNORMAL HIGH (ref 70–99)
Glucose-Capillary: 165 mg/dL — ABNORMAL HIGH (ref 70–99)
Glucose-Capillary: 266 mg/dL — ABNORMAL HIGH (ref 70–99)

## 2013-06-07 SURGERY — ANTERIOR CERVICAL DECOMPRESSION/DISCECTOMY FUSION 1 LEVEL/HARDWARE REMOVAL
Anesthesia: General | Site: Spine Cervical

## 2013-06-07 MED ORDER — METHOCARBAMOL 500 MG PO TABS
ORAL_TABLET | ORAL | Status: AC
  Filled 2013-06-07: qty 1

## 2013-06-07 MED ORDER — METFORMIN HCL ER 500 MG PO TB24
1000.0000 mg | ORAL_TABLET | Freq: Two times a day (BID) | ORAL | Status: DC
  Administered 2013-06-07 – 2013-06-08 (×2): 1000 mg via ORAL
  Filled 2013-06-07 (×4): qty 2

## 2013-06-07 MED ORDER — CEFAZOLIN SODIUM 1-5 GM-% IV SOLN
1.0000 g | Freq: Three times a day (TID) | INTRAVENOUS | Status: AC
  Administered 2013-06-07 – 2013-06-08 (×2): 1 g via INTRAVENOUS
  Filled 2013-06-07 (×2): qty 50

## 2013-06-07 MED ORDER — OXYCODONE HCL 5 MG PO TABS
ORAL_TABLET | ORAL | Status: AC
  Filled 2013-06-07: qty 2

## 2013-06-07 MED ORDER — BUPIVACAINE-EPINEPHRINE 0.25% -1:200000 IJ SOLN
INTRAMUSCULAR | Status: DC | PRN
Start: 2013-06-07 — End: 2013-06-07
  Administered 2013-06-07: 7 mL

## 2013-06-07 MED ORDER — ONDANSETRON HCL 4 MG/2ML IJ SOLN: 4.0000 mg | INTRAMUSCULAR | Status: DC | PRN

## 2013-06-07 MED ORDER — MIDAZOLAM HCL 5 MG/5ML IJ SOLN
INTRAMUSCULAR | Status: DC | PRN
  Administered 2013-06-07: 2 mg via INTRAVENOUS

## 2013-06-07 MED ORDER — ONDANSETRON HCL 4 MG/2ML IJ SOLN
INTRAMUSCULAR | Status: DC | PRN
  Administered 2013-06-07: 4 mg via INTRAVENOUS

## 2013-06-07 MED ORDER — 0.9 % SODIUM CHLORIDE (POUR BTL) OPTIME
TOPICAL | Status: DC | PRN
  Administered 2013-06-07: 1000 mL

## 2013-06-07 MED ORDER — MORPHINE SULFATE 2 MG/ML IJ SOLN
1.0000 mg | INTRAMUSCULAR | Status: DC | PRN
  Administered 2013-06-07: 2 mg via INTRAVENOUS
  Filled 2013-06-07: qty 1

## 2013-06-07 MED ORDER — SODIUM CHLORIDE 0.9 % IJ SOLN: 3.0000 mL | Freq: Two times a day (BID) | INTRAMUSCULAR | Status: DC

## 2013-06-07 MED ORDER — ROCURONIUM BROMIDE 50 MG/5ML IV SOLN
INTRAVENOUS | Status: AC
  Filled 2013-06-07: qty 1

## 2013-06-07 MED ORDER — ONDANSETRON HCL 4 MG/2ML IJ SOLN
INTRAMUSCULAR | Status: AC
  Filled 2013-06-07: qty 2

## 2013-06-07 MED ORDER — GLYBURIDE MICRONIZED 3 MG PO TABS
3.0000 mg | ORAL_TABLET | Freq: Two times a day (BID) | ORAL | Status: DC
  Administered 2013-06-07 – 2013-06-08 (×3): 3 mg via ORAL
  Filled 2013-06-07 (×4): qty 1

## 2013-06-07 MED ORDER — GLYCOPYRROLATE 0.2 MG/ML IJ SOLN
INTRAMUSCULAR | Status: DC | PRN
  Administered 2013-06-07: 0.4 mg via INTRAVENOUS

## 2013-06-07 MED ORDER — SUCCINYLCHOLINE CHLORIDE 20 MG/ML IJ SOLN
INTRAMUSCULAR | Status: DC | PRN
  Administered 2013-06-07: 100 mg via INTRAVENOUS

## 2013-06-07 MED ORDER — FENTANYL CITRATE 0.05 MG/ML IJ SOLN
INTRAMUSCULAR | Status: DC | PRN
Start: 2013-06-07 — End: 2013-06-07
  Administered 2013-06-07: 100 ug via INTRAVENOUS
  Administered 2013-06-07: 50 ug via INTRAVENOUS
  Administered 2013-06-07: 100 ug via INTRAVENOUS
  Administered 2013-06-07 (×4): 50 ug via INTRAVENOUS

## 2013-06-07 MED ORDER — FENTANYL CITRATE 0.05 MG/ML IJ SOLN
INTRAMUSCULAR | Status: AC
  Filled 2013-06-07: qty 5

## 2013-06-07 MED ORDER — PROPOFOL 10 MG/ML IV BOLUS
INTRAVENOUS | Status: AC
  Filled 2013-06-07: qty 20

## 2013-06-07 MED ORDER — SODIUM CHLORIDE 0.9 % IV SOLN
250.0000 mL | INTRAVENOUS | Status: DC
  Administered 2013-06-07: 250 mL via INTRAVENOUS

## 2013-06-07 MED ORDER — BUPIVACAINE-EPINEPHRINE (PF) 0.25% -1:200000 IJ SOLN
INTRAMUSCULAR | Status: AC
  Filled 2013-06-07: qty 30

## 2013-06-07 MED ORDER — ACETAMINOPHEN 10 MG/ML IV SOLN: 1000.0000 mg | Freq: Four times a day (QID) | INTRAVENOUS | Status: DC

## 2013-06-07 MED ORDER — ACETAMINOPHEN 10 MG/ML IV SOLN
1000.0000 mg | Freq: Four times a day (QID) | INTRAVENOUS | Status: AC
  Administered 2013-06-07 – 2013-06-08 (×4): 1000 mg via INTRAVENOUS
  Filled 2013-06-07 (×5): qty 100

## 2013-06-07 MED ORDER — MIDAZOLAM HCL 2 MG/2ML IJ SOLN
INTRAMUSCULAR | Status: AC
  Filled 2013-06-07: qty 2

## 2013-06-07 MED ORDER — DEXAMETHASONE SODIUM PHOSPHATE 4 MG/ML IJ SOLN
INTRAMUSCULAR | Status: DC | PRN
  Administered 2013-06-07: 4 mg via INTRAVENOUS

## 2013-06-07 MED ORDER — METHOCARBAMOL 1000 MG/10ML IJ SOLN
500.0000 mg | Freq: Four times a day (QID) | INTRAVENOUS | Status: DC | PRN
  Filled 2013-06-07: qty 5

## 2013-06-07 MED ORDER — ACETAMINOPHEN 10 MG/ML IV SOLN
1000.0000 mg | Freq: Once | INTRAVENOUS | Status: DC
Start: 2013-06-07 — End: 2013-06-07

## 2013-06-07 MED ORDER — THROMBIN 20000 UNITS EX SOLR
CUTANEOUS | Status: AC
  Filled 2013-06-07: qty 20000

## 2013-06-07 MED ORDER — OXYCODONE HCL 5 MG PO TABS
10.0000 mg | ORAL_TABLET | ORAL | Status: DC | PRN
  Administered 2013-06-07 – 2013-06-08 (×5): 10 mg via ORAL
  Filled 2013-06-07 (×4): qty 2

## 2013-06-07 MED ORDER — PROPOFOL 10 MG/ML IV BOLUS
INTRAVENOUS | Status: DC | PRN
  Administered 2013-06-07: 200 mg via INTRAVENOUS
  Administered 2013-06-07 (×2): 50 mg via INTRAVENOUS

## 2013-06-07 MED ORDER — THROMBIN 20000 UNITS EX SOLR
CUTANEOUS | Status: DC | PRN
  Administered 2013-06-07: 20000 [IU] via TOPICAL

## 2013-06-07 MED ORDER — ACETAMINOPHEN 10 MG/ML IV SOLN
INTRAVENOUS | Status: DC | PRN
  Administered 2013-06-07: 1000 mg via INTRAVENOUS

## 2013-06-07 MED ORDER — SODIUM CHLORIDE 0.9 % IJ SOLN: 3.0000 mL | INTRAMUSCULAR | Status: DC | PRN

## 2013-06-07 MED ORDER — METHOCARBAMOL 500 MG PO TABS
500.0000 mg | ORAL_TABLET | Freq: Four times a day (QID) | ORAL | Status: DC | PRN
  Administered 2013-06-07 – 2013-06-08 (×3): 500 mg via ORAL
  Filled 2013-06-07 (×3): qty 1

## 2013-06-07 MED ORDER — HYDROMORPHONE HCL PF 1 MG/ML IJ SOLN
0.2500 mg | INTRAMUSCULAR | Status: DC | PRN
  Administered 2013-06-07 (×2): 0.5 mg via INTRAVENOUS

## 2013-06-07 MED ORDER — BISOPROLOL-HYDROCHLOROTHIAZIDE 2.5-6.25 MG PO TABS
2.0000 | ORAL_TABLET | Freq: Every day | ORAL | Status: DC
  Administered 2013-06-08: 2 via ORAL
  Filled 2013-06-07: qty 2

## 2013-06-07 MED ORDER — THROMBIN 5000 UNITS EX SOLR
CUTANEOUS | Status: DC | PRN
  Administered 2013-06-07: 5000 [IU] via TOPICAL

## 2013-06-07 MED ORDER — LACTATED RINGERS IV SOLN
INTRAVENOUS | Status: DC
  Administered 2013-06-07 – 2013-06-08 (×2): via INTRAVENOUS

## 2013-06-07 MED ORDER — LACTATED RINGERS IV SOLN
INTRAVENOUS | Status: DC
  Administered 2013-06-07: 11:00:00 via INTRAVENOUS

## 2013-06-07 MED ORDER — PROPOFOL INFUSION 10 MG/ML OPTIME
INTRAVENOUS | Status: DC | PRN
  Administered 2013-06-07: 50 ug/kg/min via INTRAVENOUS

## 2013-06-07 MED ORDER — HYDROMORPHONE HCL PF 1 MG/ML IJ SOLN
INTRAMUSCULAR | Status: AC
  Filled 2013-06-07: qty 1

## 2013-06-07 MED ORDER — LINAGLIPTIN 5 MG PO TABS
5.0000 mg | ORAL_TABLET | Freq: Every day | ORAL | Status: DC
  Administered 2013-06-07 – 2013-06-08 (×2): 5 mg via ORAL
  Filled 2013-06-07 (×2): qty 1

## 2013-06-07 MED ORDER — LIDOCAINE HCL (CARDIAC) 20 MG/ML IV SOLN
INTRAVENOUS | Status: AC
  Filled 2013-06-07: qty 5

## 2013-06-07 MED ORDER — PHENYLEPHRINE HCL 10 MG/ML IJ SOLN
10.0000 mg | INTRAVENOUS | Status: DC | PRN
  Administered 2013-06-07: 25 ug/min via INTRAVENOUS

## 2013-06-07 MED ORDER — HEMOSTATIC AGENTS (NO CHARGE) OPTIME
TOPICAL | Status: DC | PRN
  Administered 2013-06-07: 1 via TOPICAL

## 2013-06-07 MED ORDER — THROMBIN 20000 UNITS EX KIT
PACK | CUTANEOUS | Status: AC
  Filled 2013-06-07: qty 1

## 2013-06-07 MED ORDER — PHENYLEPHRINE HCL 10 MG/ML IJ SOLN: INTRAMUSCULAR | Status: DC | PRN

## 2013-06-07 MED ORDER — LIDOCAINE HCL (CARDIAC) 20 MG/ML IV SOLN
INTRAVENOUS | Status: DC | PRN
  Administered 2013-06-07: 60 mg via INTRAVENOUS

## 2013-06-07 MED ORDER — PHENOL 1.4 % MT LIQD: 1.0000 | OROMUCOSAL | Status: DC | PRN

## 2013-06-07 MED ORDER — MENTHOL 3 MG MT LOZG: 1.0000 | LOZENGE | OROMUCOSAL | Status: DC | PRN

## 2013-06-07 MED ORDER — EPHEDRINE SULFATE 50 MG/ML IJ SOLN
INTRAMUSCULAR | Status: DC | PRN
  Administered 2013-06-07: 5 mg via INTRAVENOUS
  Administered 2013-06-07: 10 mg via INTRAVENOUS

## 2013-06-07 MED ORDER — PHENYLEPHRINE HCL 10 MG/ML IJ SOLN
INTRAMUSCULAR | Status: DC | PRN
Start: 2013-06-07 — End: 2013-06-07
  Administered 2013-06-07: 80 ug via INTRAVENOUS

## 2013-06-07 MED ORDER — LACTATED RINGERS IV SOLN
INTRAVENOUS | Status: DC | PRN
  Administered 2013-06-07 (×2): via INTRAVENOUS

## 2013-06-07 MED ORDER — INSULIN ASPART 100 UNIT/ML ~~LOC~~ SOLN
0.0000 [IU] | SUBCUTANEOUS | Status: DC
  Administered 2013-06-07: 8 [IU] via SUBCUTANEOUS
  Administered 2013-06-07: 3 [IU] via SUBCUTANEOUS
  Administered 2013-06-08: 5 [IU] via SUBCUTANEOUS
  Administered 2013-06-08: 2 [IU] via SUBCUTANEOUS

## 2013-06-07 SURGICAL SUPPLY — 61 items
BENZOIN TINCTURE PRP APPL 2/3 (GAUZE/BANDAGES/DRESSINGS) IMPLANT
BLADE 10 SAFETY STRL DISP (BLADE) ×3 IMPLANT
BLADE SURG ROTATE 9660 (MISCELLANEOUS) IMPLANT
BUR EGG ELITE 4.0 (BURR) IMPLANT
BUR EGG ELITE 4.0MM (BURR)
BUR MATCHSTICK NEURO 3.0 LAGG (BURR) IMPLANT
CAGE CERVICAL TXS 8 SM (Cage) ×2 IMPLANT
CAGE CERVICAL TXS 8MM SM (Cage) ×1 IMPLANT
CANISTER SUCTION 2500CC (MISCELLANEOUS) ×3 IMPLANT
CLOSURE STERI-STRIP 1/2X4 (GAUZE/BANDAGES/DRESSINGS) ×1
CLSR STERI-STRIP ANTIMIC 1/2X4 (GAUZE/BANDAGES/DRESSINGS) ×2 IMPLANT
CORDS BIPOLAR (ELECTRODE) ×3 IMPLANT
COVER SURGICAL LIGHT HANDLE (MISCELLANEOUS) ×6 IMPLANT
CRADLE DONUT ADULT HEAD (MISCELLANEOUS) ×3 IMPLANT
DRAPE C-ARM 42X72 X-RAY (DRAPES) ×3 IMPLANT
DRAPE POUCH INSTRU U-SHP 10X18 (DRAPES) ×3 IMPLANT
DRAPE SURG 17X23 STRL (DRAPES) ×3 IMPLANT
DRAPE U-SHAPE 47X51 STRL (DRAPES) ×6 IMPLANT
DRSG MEPILEX BORDER 4X4 (GAUZE/BANDAGES/DRESSINGS) ×3 IMPLANT
ELECT COATED BLADE 2.86 ST (ELECTRODE) ×3 IMPLANT
ELECT REM PT RETURN 9FT ADLT (ELECTROSURGICAL) ×3
ELECTRODE REM PT RTRN 9FT ADLT (ELECTROSURGICAL) ×1 IMPLANT
GLOVE BIOGEL PI IND STRL 8 (GLOVE) ×1 IMPLANT
GLOVE BIOGEL PI IND STRL 8.5 (GLOVE) ×1 IMPLANT
GLOVE BIOGEL PI INDICATOR 8 (GLOVE) ×2
GLOVE BIOGEL PI INDICATOR 8.5 (GLOVE) ×2
GLOVE ECLIPSE 8.5 STRL (GLOVE) ×6 IMPLANT
GLOVE ORTHO TXT STRL SZ7.5 (GLOVE) ×3 IMPLANT
GOWN STRL REUS W/ TWL XL LVL3 (GOWN DISPOSABLE) ×2 IMPLANT
GOWN STRL REUS W/TWL 2XL LVL3 (GOWN DISPOSABLE) ×6 IMPLANT
GOWN STRL REUS W/TWL XL LVL3 (GOWN DISPOSABLE) ×4
KIT BASIN OR (CUSTOM PROCEDURE TRAY) ×3 IMPLANT
KIT ROOM TURNOVER OR (KITS) ×3 IMPLANT
NEEDLE SPNL 18GX3.5 QUINCKE PK (NEEDLE) ×3 IMPLANT
NEURO MONITORING STIM (LABOR (TRAVEL & OVERTIME)) ×3 IMPLANT
NS IRRIG 1000ML POUR BTL (IV SOLUTION) ×3 IMPLANT
PACK ORTHO CERVICAL (CUSTOM PROCEDURE TRAY) ×3 IMPLANT
PACK UNIVERSAL I (CUSTOM PROCEDURE TRAY) ×3 IMPLANT
PAD ARMBOARD 7.5X6 YLW CONV (MISCELLANEOUS) ×6 IMPLANT
PATTIES SURGICAL .25X.25 (GAUZE/BANDAGES/DRESSINGS) IMPLANT
PATTIES SURGICAL .5 X.5 (GAUZE/BANDAGES/DRESSINGS) IMPLANT
PIN DISTRACTION 14 (PIN) ×3 IMPLANT
PIN RETAINER PRODISC 14 MM (PIN) ×3 IMPLANT
PUTTY BONE DBX 2.5 MIS (Bone Implant) ×3 IMPLANT
RESTRAINT LIMB HOLDER UNIV (RESTRAINTS) ×3 IMPLANT
SCREW TCS 14X3.5MM (Screw) ×3 IMPLANT
SCREW TCS 16X3.5MM (Screw) ×6 IMPLANT
SPONGE INTESTINAL PEANUT (DISPOSABLE) ×6 IMPLANT
SPONGE LAP 4X18 X RAY DECT (DISPOSABLE) IMPLANT
SPONGE SURGIFOAM ABS GEL 100 (HEMOSTASIS) ×3 IMPLANT
SURGIFLO TRUKIT (HEMOSTASIS) ×3 IMPLANT
SUT MON AB 3-0 SH 27 (SUTURE) ×2
SUT MON AB 3-0 SH27 (SUTURE) ×1 IMPLANT
SUT VIC AB 2-0 CT1 36 (SUTURE) ×3 IMPLANT
SYR CONTROL 10ML LL (SYRINGE) ×3 IMPLANT
TAPE CLOTH 4X10 WHT NS (GAUZE/BANDAGES/DRESSINGS) ×3 IMPLANT
TAPE UMBILICAL COTTON 1/8X30 (MISCELLANEOUS) ×6 IMPLANT
TOWEL OR 17X24 6PK STRL BLUE (TOWEL DISPOSABLE) ×3 IMPLANT
TOWEL OR 17X26 10 PK STRL BLUE (TOWEL DISPOSABLE) ×3 IMPLANT
TRAY FOLEY CATH 16FRSI W/METER (SET/KITS/TRAYS/PACK) IMPLANT
WATER STERILE IRR 1000ML POUR (IV SOLUTION) ×3 IMPLANT

## 2013-06-07 NOTE — Anesthesia Preprocedure Evaluation (Addendum)
Anesthesia Evaluation  Patient identified by MRN, date of birth, ID band Patient awake    Reviewed: Allergy & Precautions, H&P , NPO status , Patient's Chart, lab work & pertinent test results  History of Anesthesia Complications (+) PONV  Airway Mallampati: II      Dental   Pulmonary  breath sounds clear to auscultation        Cardiovascular hypertension, Rhythm:Regular Rate:Normal     Neuro/Psych    GI/Hepatic negative GI ROS, Neg liver ROS,   Endo/Other  diabetes  Renal/GU negative Renal ROS     Musculoskeletal   Abdominal   Peds  Hematology   Anesthesia Other Findings   Reproductive/Obstetrics                          Anesthesia Physical Anesthesia Plan  ASA: III  Anesthesia Plan: General   Post-op Pain Management:    Induction: Intravenous  Airway Management Planned: Oral ETT  Additional Equipment:   Intra-op Plan:   Post-operative Plan: Extubation in OR  Informed Consent: I have reviewed the patients History and Physical, chart, labs and discussed the procedure including the risks, benefits and alternatives for the proposed anesthesia with the patient or authorized representative who has indicated his/her understanding and acceptance.   Dental advisory given  Plan Discussed with: CRNA, Anesthesiologist and Surgeon  Anesthesia Plan Comments:         Anesthesia Quick Evaluation

## 2013-06-07 NOTE — Transfer of Care (Signed)
Immediate Anesthesia Transfer of Care Note  Patient: Laurie Burch  Procedure(s) Performed: Procedure(s): EXPLORATION OF FUSION AND REMOVAL OF CERVICAL HARDWARE AND ACDF C4-5 (N/A)  Patient Location: PACU  Anesthesia Type:General  Level of Consciousness: awake, alert , oriented and patient cooperative  Airway & Oxygen Therapy: Patient Spontanous Breathing and Patient connected to nasal cannula oxygen  Post-op Assessment: Report given to PACU RN, Post -op Vital signs reviewed and stable and Patient moving all extremities X 4  Post vital signs: Reviewed and stable  Complications: No apparent anesthesia complications

## 2013-06-07 NOTE — Anesthesia Postprocedure Evaluation (Signed)
  Anesthesia Post-op Note  Patient: Laurie Burch  Procedure(s) Performed: Procedure(s): EXPLORATION OF FUSION AND REMOVAL OF CERVICAL HARDWARE AND ACDF C4-5 (N/A)  Patient Location: PACU  Anesthesia Type:General  Level of Consciousness: awake  Airway and Oxygen Therapy: Patient Spontanous Breathing  Post-op Pain: mild  Post-op Assessment: Post-op Vital signs reviewed  Post-op Vital Signs: Reviewed  Last Vitals:  Filed Vitals:   06/07/13 1600  BP:   Pulse: 71  Temp:   Resp: 15    Complications: No apparent anesthesia complications

## 2013-06-07 NOTE — Brief Op Note (Signed)
06/07/2013  3:35 PM  PATIENT:  Laurie Burch  63 y.o. 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  PROCEDURE:  Procedure(s): EXPLORATION OF FUSION AND REMOVAL OF CERVICAL HARDWARE AND ACDF C4-5 (N/A)  SURGEON:  Surgeon(s) and Role:    * Melina Schools, MD - Primary  PHYSICIAN ASSISTANT:   ASSISTANTS: Benjiman Core   ANESTHESIA:   general  EBL:  Total I/O In: 1000 [I.V.:1000] Out: 200 [Urine:200]  BLOOD ADMINISTERED:none  DRAINS: none and Gastrostomy Tube   LOCAL MEDICATIONS USED:  MARCAINE     SPECIMEN:  No Specimen  DISPOSITION OF SPECIMEN:  N/A  COUNTS:  YES  TOURNIQUET:  * No tourniquets in log *  DICTATION: .Other Dictation: Dictation Number [id number]  PLAN OF CARE: Admit to inpatient   PATIENT DISPOSITION:  PACU - hemodynamically stable.

## 2013-06-07 NOTE — H&P (Signed)
History of Present Illness  The patient is a 63 year old female who presents to the practice today for a transition into care. The patient is transitioning into care from another physician (Dr Tonita Cong for neck pain and i s here for a surgical consult) .  The patient is being followed for their right shoulder pain (right sided into the shoulder and arm with numbness right thumb , index and long fingers). They are 4 month(s) out from when symptoms began and 5 weeks out from a cortisone injection. Symptoms reported today include: pain (shoulder and upper arm) and numbness (right fingers). and report their pain level to be moderate. Current treatment includes: home exercise program, NSAIDs and heat. The following medication has been used for pain control: Norco. The patient indicates that they have questions or concerns today regarding She did have a Cervcial fusion by Dr Vertell Limber in 2008.   REASON FOR CONSULTATION:  Progressive debilitating neck pain.  Bilateral arm pain.  Gait disturbance.   Arnola is a very pleasant young woman who has noted increasing problems with her neck over the last several years. She states that she had an ACDF at C5-6 and C6-7 in 2008 which left her with a partial vocal cord paralysis and ultimately went on to have significant dysphagia, hoarseness in the voice and she has been getting progressive neck pain. As a result of this, she was referred to my partner, Johnn Hai, MD, who then sent her to me for definitive management.  Allergies PredniSONE (Pak) *CORTICOSTEROIDS* PREDNISONE. 06/18/1997 intolerant to large doses (Marked as Inactive)    Family History Heart Disease. Father, Mother. Hypertension. Brother, Father, Mother. Rheumatoid Arthritis. Father. Cerebrovascular Accident. Father. Depression. Mother. Diabetes Mellitus. Brother, Father, Mother. First Degree Relatives    Social History Tobacco use. Never smoker.  03/28/2013 Number of flights of stairs before winded. 1 Not under pain contract Tobacco / smoke exposure. 03/28/2013: no No history of drug/alcohol rehab Exercise. Exercises weekly; does running / walking Current work status. working full time Living situation. live with spouse Never consumed alcohol. 03/28/2013: Never consumed alcohol Marital status. married Children. 1    Medication History Pravastatin Sodium (40MG  Tablet, Oral) Active. (qd) GlyBURIDE Micronized (3MG  Tablet, Oral) Active. (bid) Diclofenac Sodium (75MG  Tablet DR, Oral) Active. (bid) MetFORMIN HCl ER (500MG  Tablet ER 24HR, Oral) Active. (bid) NexIUM (40MG  Capsule DR, Oral) Active. (qd) Bisoprolol-Hydrochlorothiazide (2.5-6.25MG  Tablet, Oral) Active. (qd) Onglyza (5MG  Tablet, Oral) Active. (qd) Iron (65MG  Tablet, Oral) Active. (qd) One-A-Day 55 Plus ( Oral) Active. (qd) Cinnamon (500MG  Tablet, Oral) Active. (qd) Vitamin E (400UNIT Tablet, Oral) Active. (qd) Omega-3 Fish Oil (1000MG  Capsule, Oral) Active. (qd) Vitamin B-6 (100MG  Tablet, Oral) Active. (qd) Caltrate 600+D Plus Minerals (600-800MG -UNIT Tablet, Oral) Active. (qd) Aspirin (81MG  Tablet, 1 (one) Oral) Active. (qd) Medications Reconciled.    Objective Transcription  She is pleasant and appears younger than her stated age. She is alert and oriented times three. She has significant neck pain with forward flexion, extension, rotation and lateral bending. She has no shortness of breath or chest pain. The abdomen is soft and nontender. No loss of bowel and bladder. She does have a broad shuffling gait. She has difficulty maintaining her balance and she can not heel-toe walk without almost falling over. She has upgoing toes with Babinski testing. She has an unequivocal Hoffman's sign. No clonus. Upper and lower extremity deep tendon reflexes are brisk. She has no real hip, knee or ankle pain. She does have a lot of  shoulder pain, scapular pain as  well as numbness and dysesthesias into the upper extremities bilaterally.    RADIOGRAPHS:  X-rays from my office were reviewed. She does appear to have a solid fusion at C5-6 and C6-7. There does appear to be hardware complications at C7. There is a very prominent screw which appears to be broken. She has two radiolucent cages at the disk spaces but there does not appear to be any subsidence of the marker beads.    The MRI clearly shows moderate canal stenosis. There is a cord deformity and myelomalacia at that level. There is a mass effect on both the C5 nerve roots. This is worse on the right than on the left. There is substantial intramedullary signal disturbance at the C4-5 junction extending through C5-6. This is a combination of intramedullary edema and most likely represents myelomalacia. At this point, there is also moderate severe multi-level facet arthrosis in the upper thoracic spine.   Assessment & Plan  At this point in time, the patient's clinical symptomatology is significant for myelopathy. She has upgoing toes on Babinski testing. Difficulty maintaining her balance. She has an altered gait and brisk reflexes. These are all signs and symptoms of myelopathy. She also has dysphagia and dysphonia. There is a broken screw which is prominent in the cervical spine.    With respect to her issues, I am quite concerned about the myelopathy. We have gone over the natural history which is one of progression. My concern is that this will lead to greater balance and gait disturbances and eventually significant proximal musculature weakness in the thigh which will put her in a wheelchair. This is all backed by the natural history studies of myelopathy.     Plans Transcription  The appropriate conservative management of cervical spondylolytic myelopathy is actually surgical decompression. This would require an ACDF at  C4-5. In addition, I have significant concerns about the  hardware complication. She is already having dysphagia which has gotten somewhat progressively worse. My concern is that over time the broken screw could actually erode into the esophagus and lead to an esophageal tear which has a high morbidity and mortality rate. Therefore, I think by returning to the anterior aspect of the spine, I could at the very least remove the broken screw and hopefully remove the remaining hardware, evaluate the fusion and then do an ACDF at C4-5 with a zero profile device decreasing the overall likelihood of worsening post operative dysphagia.    I did review the risk with her to include infection, bleeding, nerve damage, death , stroke, paralysis, failure to heal, need for further surgery, ongoing or worse pain. Since she has known vocal cord compromise from her previous left sided ACDF, which was done through a transverse incision, my plan would be to do a left standard Alben Deeds approach which would be a longitudinal based incision. She will probably be in the hospital one and maybe two nights depending on how she does. 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    The patient was present for the dictation and is expressing an understanding of the risks, benefits and the goal of surgery which is to prevent the progression of her myelopathic symptoms with a secondary goal to hopefully improve her overall condition.

## 2013-06-08 LAB — GLUCOSE, CAPILLARY
GLUCOSE-CAPILLARY: 111 mg/dL — AB (ref 70–99)
GLUCOSE-CAPILLARY: 137 mg/dL — AB (ref 70–99)
GLUCOSE-CAPILLARY: 95 mg/dL (ref 70–99)
Glucose-Capillary: 130 mg/dL — ABNORMAL HIGH (ref 70–99)
Glucose-Capillary: 212 mg/dL — ABNORMAL HIGH (ref 70–99)

## 2013-06-08 LAB — HEMOGLOBIN A1C
Hgb A1c MFr Bld: 7 % — ABNORMAL HIGH (ref <5.7)
Mean Plasma Glucose: 154 mg/dL — ABNORMAL HIGH (ref <117)

## 2013-06-08 MED ORDER — POLYETHYLENE GLYCOL 3350 17 G PO PACK: 17.0000 g | PACK | Freq: Every day | ORAL | Status: DC

## 2013-06-08 MED ORDER — METHOCARBAMOL 500 MG PO TABS: 500.0000 mg | ORAL_TABLET | Freq: Four times a day (QID) | ORAL | Status: DC | PRN

## 2013-06-08 MED ORDER — DOCUSATE SODIUM 100 MG PO CAPS: 100.0000 mg | ORAL_CAPSULE | Freq: Two times a day (BID) | ORAL | Status: DC

## 2013-06-08 MED ORDER — ONDANSETRON 4 MG PO TBDP: 4.0000 mg | ORAL_TABLET | Freq: Three times a day (TID) | ORAL | Status: DC | PRN

## 2013-06-08 MED ORDER — OXYCODONE-ACETAMINOPHEN 10-325 MG PO TABS: 1.0000 | ORAL_TABLET | Freq: Four times a day (QID) | ORAL | Status: DC | PRN

## 2013-06-08 NOTE — Progress Notes (Signed)
    Subjective: Procedure(s) (LRB): EXPLORATION OF FUSION AND REMOVAL OF CERVICAL HARDWARE AND ACDF C4-5 (N/A) 1 Day Post-Op  Patient reports pain as 3 on 0-10 scale.  Reports decreased arm pain reports incisional neck pain   Positive void Negative bowel movement Positive flatus Negative chest pain or shortness of breath  Objective: Vital signs in last 24 hours: Temp:  [97.6 F (36.4 C)-98.5 F (36.9 C)] 98 F (36.7 C) (05/01 0442) Pulse Rate:  [59-77] 59 (05/01 0442) Resp:  [11-20] 18 (05/01 0442) BP: (125-176)/(52-94) 125/52 mmHg (05/01 0442) SpO2:  [93 %-100 %] 98 % (05/01 0442) Weight:  [197 lb (89.359 kg)] 197 lb (89.359 kg) (04/30 1030)  Intake/Output from previous day: 04/30 0701 - 05/01 0700 In: 2938.8 [P.O.:310; I.V.:2628.8] Out: 1050 [Urine:1050]  Labs: No results found for this basename: WBC, RBC, HCT, PLT,  in the last 72 hours No results found for this basename: NA, K, CL, CO2, BUN, CREATININE, GLUCOSE, CALCIUM,  in the last 72 hours No results found for this basename: LABPT, INR,  in the last 72 hours  Physical Exam: ABD soft Incision: dressing C/D/I and no drainage Compartment soft Ambulating with greater stability 5/5 motor exam.  Numbness improving from pre-op   Assessment/Plan: Patient stable  xrays satisfactory Mobilization with physical therapy Encourage incentive spirometry Continue care  Advance diet Up with therapy D/C IV fluids D/c today or in AM   Melina Schools, MD [place] [phone]'

## 2013-06-08 NOTE — Plan of Care (Signed)
Problem: Consults Goal: Diagnosis - Spinal Surgery Outcome: Completed/Met Date Met:  06/08/13 Cervical Spine Fusion     

## 2013-06-08 NOTE — Progress Notes (Signed)
Physical Therapy Note  Physical therapist spoke with occupational therapist after OT initial evaluation. States pt is functioning at a high level and physical therapy is not indicated at this time. PT is signing-off. Thank you for this referral.  Elayne Snare, [place]

## 2013-06-08 NOTE — Discharge Instructions (Signed)

## 2013-06-08 NOTE — Op Note (Signed)
NAMEAFIA, Laurie Burch               ACCOUNT NO.:  [id number]  MEDICAL RECORD NO.:  [id number]  LOCATION:  [id number]                        FACILITY:  [facility]  PHYSICIAN:  Dahlia Bailiff, MD    DATE OF BIRTH:  [birth year]  DATE OF PROCEDURE:  06/07/2013 DATE OF DISCHARGE:                              OPERATIVE REPORT   PREOPERATIVE DIAGNOSES: 1. Cervical spondylotic myelopathy. 2. Symptomatic cervical hardware.  POSTOPERATIVE DIAGNOSES: 1. Cervical spondylotic myelopathy. 2. Symptomatic cervical hardware.  OPERATIVE PROCEDURES: 1. Removal of anterior cervical hardware and exploration of fusion, C5-     6 and C6-7. 2. Anterior cervical diskectomy and fusion, C4-5.  INTRAOPERATIVE FINDINGS:  The right C7 screw was in fact broken.  I removed the portion that was visible, the rest was within the vertebral body, I could not remove it.  The plate was removed with all other screws intact.  I used the Titan Titanium 0 profile cage with locking screws, 16 mm in length.  The cage itself was 8 small internal lordotic cage.  HISTORY:  This is a very pleasant 63 year old woman who in 2008 had an anterior cervical diskectomy and fusion at C5-6 and C6-7. Postoperatively, she had vocal cord paralysis and swallowing difficulties.  She has been followed for some time now and she has been having progressive loss in quality of life, difficulty ambulating and significant pain.  Imaging studies showed that the right C7 locking screw had migrated out and may have been fractured.  As a result, she presented to me for further workup and treatment.  During the course of her presentation, I noted that she has signs and symptoms of myelopathy. Imaging studies confirmed at the adjacent segment, there was cord signal changes with central disk herniation.  After discussing risks, benefits and alternatives to surgery, we elected to proceed with aforementioned surgery.  OPERATIVE NOTE:  The patient was brought  to the operating room and placed supine on the operating table.  After successful induction of general anesthesia and endotracheal intubation, TEDs, SCDs, and Foley were inserted.  Roller towels were placed between the shoulder blades and the arms were secured at the side.  NeuroStim provided intraoperative EMGs, evoked motor and SSEP monitoring.  Once all the neuromonitoring leads were positioned, the neck was prepped and draped in a standard fashion.  Time-out was taken to confirm patient, procedure and all other pertinent important data.  The patient had previous low-transverse incision for her previous surgery because of the already compromised vocal cords and the dysphasia, I elected to use the same side which was the left side as my approach.  Longitudinal incision was made, and a standard Smith-Robinson approach to the anterior cervical spine was taken.  Sharp dissection was carried out down to the platysma.  The platysma was sharply incised in line with the incision.  I identified the sternocleidomastoid and began dissecting on the medial border of this until I could visualize the omohyoid.  Once the omohyoid was visualized, I sacrificed this which allowed for much greater visualization.  I then bluntly dissected through the remaining deep cervical and prevertebral fascia until I could palpate the anterior cervical spine at the L4-5 level.  I then identified the esophagus and identified the carotid sheath and began dissecting inferiorly using Kittner dissectors to bluntly dissect.  I swept the esophagus to the right and protected with a thyroid retractor and identified and protected the carotid sheath with my finger.  Once I had the entire 5-7 plate exposed, I then noted that the right C7 screw had indeed migrated out and was prominent.  I gently elevated the esophagus up and over this.  Upon inspection, I did not see any flank direct trauma to the esophagus.  It did not appear  to have a leak or a puncture wound.  I then removed the screw and it was in fact broken.  I then removed the left C7 screw as well.  I then proceeded to remove the 6 and 5 screws and then removed the entire plate.  Once the plate was removed, I could now visualize the 5-6 and 6-7 disk space.  She had solid fusions.  There was no evidence of any nonunion of the interbody fusion.  I could not visualize or palpate the C7 screw that remained and so I just placed bone wax over the exposed screw hole.  With the plate and hardware removed, I then proceeded with the ACDF at the 4-5 level. I took an x-ray confirming the appropriate level, and then placed self- retaining retractors underneath the longus colli muscle.  I deflated the endotracheal cuff, expanded the retractors, and reinflated them.  An annulotomy was performed with a 15-blade scalpel and then using a combination of pituitary rongeurs, curettes, and Kerrison rongeurs, I removed the bulk of the disk material.  I then placed distraction pins into the body of C4 and then I used the previous C5 screw hole for my distraction hole at C5.  I distracted the space and continued removing disk material.  There was actually in fact a central disk herniation consistent with what was seen on the MRI.  I used a nerve hook to gently remove this fragment.  Once this fragment was removed, I used the defect that I had created to develop a plane between the thecal sac and the posterior longitudinal ligament.  I then used my 1-mm Kerrison to resect the posterior longitudinal ligament and remaining posterior annulus. Once I had done this, there was in fact areas of petechial change in compression of the thecal sac seen.  Once I had the decompression complete, I could run my nerve hook underneath the posterior aspect of the 4 and 5 vertebral bodies, and there was no further compression.  At this point, I irrigated the wound copiously with normal saline, and  then I used rasp to remove the remaining portions of cartilage from the cartilaginous endplate.  I then trialed varying sizes and elected to use an 8 small intervertebral spacer.  I obtained the 0 profile device as this would decrease the likelihood of ongoing esophageal compression or irritation.  This was packed with DBX Mix and malleted to the appropriate depth.  Once this was done, I then used two 16-mm locking screws to secure the 0 profile plate in position.  Both screws had self-locking devices and they were properly torqued down.  At this point, I then irrigated the wound copiously with normal saline, removed the retracting devices and made sure I had hemostasis.  I returned the trachea and esophagus to midline, and then closed the fascia of the platysma with interrupted 2-0 Vicryl sutures.  I then closed the superficial skin with a  running 3-0 Monocryl.  Steri-Strips and dry dressing were applied.  The patient was ultimately extubated and transferred to the PACU without incident. First assistant was Benjiman Core, my PA, he was instrumental in assisting with retraction, visualization, suction, and wound closure.     Dahlia Bailiff, MD     DDB/MEDQ  D:  06/07/2013  T:  06/08/2013  Job:  [id number]

## 2013-06-08 NOTE — Evaluation (Signed)
Occupational Therapy Evaluation and Discharge Patient Details Name: Laurie Burch MRN: [id number] DOB: [birth year] Today's Date: 06/08/2013    History of Present Illness Anterior cervical diskectomy and fusion, C4-5.   Clinical Impression   This 63 yo female having RUE issues and balance issues pta was admitted and underwent above, presents to acute OT at a mod I level for all BADLs and mobility. No further OT needs identified and no PT needs noted--made them aware. Acute OT will sign off.    Follow Up Recommendations  No OT follow up    Equipment Recommendations  None recommended by OT (pt to get a chair to put in her shower on her own.)       Precautions / Restrictions Precautions Precautions: Cervical Required Braces or Orthoses: Cervical Brace Cervical Brace: Hard collar (off when in a chair and eating per pt) Restrictions Weight Bearing Restrictions: No      Mobility Bed Mobility Overal bed mobility: Modified Independent                Transfers Overall transfer level: Modified independent               General transfer comment: able to go up 2 steps and then down 3 with HAA by her husband--no issues         ADL Overall ADL's : Modified independent                                       General ADL Comments: Pt aware that if she showers with collar off to be really careful and not move her neck much or she can shower in her collar and change out the pads after wards.  Educated pt and husband on how to change out and care for pads of brace as well as how to adjust collar vertically               Pertinent Vitals/Pain I did not ask her to rate, she said her neck was hurting (she felt like due to she had had the collar off while she was eating lunch)     Hand Dominance Right   Extremity/Trunk Assessment Upper Extremity Assessment Upper Extremity Assessment: RUE deficits/detail RUE Deficits / Details: Still reports decreased  sensation in finger tips and lessened pain in shoulder (than prior to surgery)   Lower Extremity Assessment Lower Extremity Assessment: Overall WFL for tasks assessed       Communication Communication Communication: No difficulties   Cognition Arousal/Alertness: Awake/alert Behavior During Therapy: WFL for tasks assessed/performed Overall Cognitive Status: Within Functional Limits for tasks assessed                                Home Living Family/patient expects to be discharged to:: Private residence Living Arrangements: Spouse/significant other Available Help at Discharge: Family;Available 24 hours/day Type of Home: House Home Access: Level entry     Home Layout: One level         Bathroom Toilet: Standard     Home Equipment: None          Prior Functioning/Environment Level of Independence: Independent                      OT Goals(Current goals can be found in the care plan section) Acute Rehab OT Goals  Patient Stated Goal: home today  OT Frequency:                End of Session Nurse Communication:  (Good to go from OT standpoint)  Activity Tolerance: Patient tolerated treatment well Patient left: in chair;with call bell/phone within reach   Time: 1226-1251 OT Time Calculation (min): 25 min Charges:  OT General Charges $OT Visit: 1 Procedure OT Evaluation $Initial OT Evaluation Tier I: 1 Procedure OT Treatments $Self Care/Home Management : 8-22 mins  [name] [phone] 06/08/2013, 1:46 PM

## 2013-06-11 ENCOUNTER — Encounter (HOSPITAL_COMMUNITY): Payer: Self-pay | Admitting: Orthopedic Surgery

## 2013-06-11 NOTE — Care Management Note (Signed)
CARE MANAGEMENT NOTE 06/11/2013  Patient:  Laurie Burch, Laurie Burch   Account Number:  [id number]  Date Initiated:  06/11/2013  Documentation initiated by:  Ricki Miller  Subjective/Objective Assessment:   63 yr old female s/p removal of anterior cervical hardware and exploration of C5-6, C6-7, Anterior cervical disckectomy and fusion C4-5.     Action/Plan:   Physical and occupational therapy state patient is functioning al a high level and has no home health therapy needs.  No case manager needs.   Anticipated DC Date:  06/07/2013   Anticipated DC Plan:  [place]  CM consult      Choice offered to / List presented to:             Status of service:  Completed, signed off Medicare Important Message given?   (If response is "NO", the following Medicare IM given date fields will be blank) Date Medicare IM given:   Date Additional Medicare IM given:    Discharge Disposition:  HOME/SELF CARE  Per UR Regulation:    If discussed at Long Length of Stay Meetings, dates discussed:    Comments:

## 2013-06-12 NOTE — Discharge Summary (Signed)
Patient ID: Laurie Burch MRN: [id number] DOB/AGE: [birth year] 63 y.o.  Admit date: 06/07/2013 Discharge date: 06/12/2013  Admission Diagnoses:  Active Problems:   Neck pain   Discharge Diagnoses:  Active Problems:   Neck pain  status post Procedure(s): EXPLORATION OF FUSION AND REMOVAL OF CERVICAL HARDWARE AND ACDF C4-5  Past Medical History  Diagnosis Date  . Mitral valve prolapse   . PONV (postoperative nausea and vomiting)   . Hypertension   . Hyperlipemia   . Diabetes mellitus without complication   . Bronchitis     hx of  . Headache(784.0)   . Gastritis     Takes Nexium  . Arthritis   . 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 female who was admitted 06/07/2013 for operative treatment of cervical retained hardware and C4-5 ddd. Patient failed conservative treatments (please see the history and physical for the specifics) and had severe unremitting pain that affects sleep, daily activities and work/hobbies. After pre-op clearance, the patient was taken to the operating room on 06/07/2013 and underwent  Procedure(s): EXPLORATION OF FUSION AND REMOVAL OF CERVICAL HARDWARE AND ACDF C4-5.    Patient was given perioperative antibiotics:  Anti-infectives   Start     Dose/Rate Route Frequency Ordered Stop   06/07/13 2100  ceFAZolin (ANCEF) IVPB 1 g/50 mL premix     1 g 100 mL/hr over 30 Minutes Intravenous Every 8 hours 06/07/13 1745 06/08/13 0604   06/06/13 1422  ceFAZolin (ANCEF) IVPB 2 g/50 mL premix     2 g 100 mL/hr over 30 Minutes Intravenous 30 min pre-op 06/06/13 1422 06/07/13 1245       Patient was given sequential compression devices and early ambulation to prevent DVT.   Patient benefited maximally from hospital stay and there were no  complications. At the time of discharge, the patient was urinating/moving their bowels without difficulty, tolerating a regular diet, pain is controlled with oral pain medications and they have been cleared by PT/OT.   Recent vital signs: No data found.    Recent laboratory studies: No results found for this basename: WBC, HGB, HCT, PLT, NA, K, CL, CO2, BUN, CREATININE, GLUCOSE, PT, INR, CALCIUM, 2,  in the last 72 hours   Discharge Medications:     Medication List    STOP taking these medications       aspirin EC 81 MG tablet     diclofenac 75 MG EC tablet  Commonly known as:  VOLTAREN     FISH OIL PEARLS PO      TAKE these medications       bisoprolol-hydrochlorothiazide 2.5-6.25 MG per tablet  Commonly known as:  ZIAC  Take 2 tablets by mouth daily.     calcium-vitamin D 500-200 MG-UNIT per tablet  Commonly known as:  OSCAL WITH D  Take 1 tablet by mouth daily with breakfast.     Cinnamon 500 MG Tabs  Take 500 mg by mouth daily.     docusate sodium 100 MG capsule  Commonly known as:  COLACE  Take 1 capsule (100 mg total) by mouth 2 (two) times daily.     esomeprazole 20 MG capsule  Commonly known as:  NEXIUM  Take 20 mg by mouth daily at 12  noon.     glyBURIDE micronized 3 MG tablet  Commonly known as:  GLYNASE  Take 3 mg by mouth 2 (two) times daily with a meal.     metFORMIN 500 MG 24 hr tablet  Commonly known as:  GLUCOPHAGE-XR  Take 1,000 mg by mouth 2 (two) times daily.     methocarbamol 500 MG tablet  Commonly known as:  ROBAXIN  Take 1 tablet (500 mg total) by mouth every 6 (six) hours as needed for muscle spasms.     ondansetron 4 MG disintegrating tablet  Commonly known as:  ZOFRAN ODT  Take 1 tablet (4 mg total) by mouth every 8 (eight) hours as needed for nausea or vomiting.     ONGLYZA 5 MG Tabs tablet  Generic drug:  saxagliptin HCl  Take 5 mg by mouth at bedtime.     oxyCODONE-acetaminophen 10-325 MG per tablet  Commonly known as:   PERCOCET  Take 1 tablet by mouth every 6 (six) hours as needed for pain.     polyethylene glycol packet  Commonly known as:  MIRALAX / GLYCOLAX  Take 17 g by mouth daily.     pravastatin 40 MG tablet  Commonly known as:  PRAVACHOL  Take 80 mg by mouth at bedtime.     vitamin B-12 1000 MCG tablet  Commonly known as:  CYANOCOBALAMIN  Take 1,000 mcg by mouth daily.        Diagnostic Studies: Dg Chest 2 View  06/04/2013   CLINICAL DATA:  Preoperative evaluation for cervical spine fusion, history hypertension, diabetes, hyperlipidemia  EXAM: CHEST  2 VIEW  COMPARISON:  04/27/2006  FINDINGS: Upper normal heart size.  No mediastinal contours and pulmonary vascularity.  Lungs clear.  No pleural effusion or pneumothorax.  Prior C7-T1 fusion.  Bones demineralized.  IMPRESSION: No acute abnormalities.   Electronically Signed   By: Lavonia Dana M.D.   On: 06/04/2013 15:48   Dg Cervical Spine 2-3 Views  06/07/2013   CLINICAL DATA:  intra op  EXAM: CERVICAL SPINE - 2-3 VIEW  COMPARISON:  DG CERVICAL SPINE 2-3 VIEWS dated 06/04/2013  FINDINGS: Patient is status post C4-5 fusion. Hardware native osseous structures appear intact.  IMPRESSION: Patient is status post C4-5 fusion.   Electronically Signed   By: Margaree Mackintosh M.D.   On: 06/07/2013 18:37   Dg Cervical Spine 2 Or 3 Views  06/07/2013   CLINICAL DATA:  Postop from anterior cervical disc fusion.  EXAM: CERVICAL SPINE - 2-3 VIEW  COMPARISON:  06/04/2013  FINDINGS: Previously seen anterior cervical fixation plate screws have been removed, and a new interbody fusion device is seen in appropriate position at C4-5. Old interbody fusion again seen at C5-6. Cervical alignment is normal.  IMPRESSION: New anterior interbody fusion device at C4-5, with normal alignment.   Electronically Signed   By: Earle Gell M.D.   On: 06/07/2013 16:34   Dg Cervical Spine 2 Or 3 Views  06/04/2013   CLINICAL DATA:  Preoperative cervical fusion  EXAM: CERVICAL SPINE - 2-3  VIEW  COMPARISON:  None.  FINDINGS: Frontal and lateral views were obtained. The patient is status post anterior fusion from C5-C7. Screw and plate fixation device is intact as well as bony plugs at C5-6 and C6-7. There is no fracture or spondylolisthesis. Prevertebral soft tissues and predental space regions are normal. There is slight disc space narrowing at C4-5. No erosive change.  IMPRESSION: Postoperative change from C5-C7. Mild disc space narrowing C4-5.  No fracture or spondylolisthesis.   Electronically Signed   By: Lowella Grip M.D.   On: 06/04/2013 15:48   Dg C-arm 61-120 Min  06/07/2013   CLINICAL DATA:  intra op  EXAM: DG C-ARM 61-120 MIN  COMPARISON:  None.  FINDINGS: The patient is status post C4-5 fusion. Hardware appears intact. No gross osseous abnormality is identified.  IMPRESSION: Patient status post C4-5 fusion.   Electronically Signed   By: Margaree Mackintosh M.D.   On: 06/07/2013 18:36        Discharge Orders   Future Orders Complete By Expires   Call MD / Call 911  As directed    Constipation Prevention  As directed    Diet - low sodium heart healthy  As directed    Driving restrictions  As directed    Increase activity slowly as tolerated  As directed    Lifting restrictions  As directed       Follow-up Information   Schedule an appointment as soon as possible for a visit with Dahlia Bailiff, MD. (need return office visit 2 weeks postop)    Specialty:  Orthopedic Surgery   Contact information:   [address] [place] [address] [place] [address] [phone]       Discharge Plan:  discharge to home  Disposition:     Signed: Benjiman Core for Dr. Melina Schools [facility] [phone] 06/12/2013, 11:06 AM

## 2013-06-12 NOTE — Discharge Summary (Signed)
Agree with above 

## 2013-06-14 MED FILL — Thrombin For Soln Kit 20000 Unit: CUTANEOUS | Qty: 1 | Status: AC

## 2013-08-03 NOTE — OR Nursing (Signed)
Addendum to scope page 

## 2014-05-13 ENCOUNTER — Emergency Department (HOSPITAL_COMMUNITY)
Admission: EM | Admit: 2014-05-13 | Discharge: 2014-05-13 | Disposition: A | Discharge status: Home / Self Care | Payer: BLUE CROSS/BLUE SHIELD | Attending: Emergency Medicine | Admitting: Emergency Medicine

## 2014-05-13 ENCOUNTER — Encounter (HOSPITAL_COMMUNITY): Payer: Self-pay

## 2014-05-13 ENCOUNTER — Emergency Department (HOSPITAL_COMMUNITY): Payer: BLUE CROSS/BLUE SHIELD

## 2014-05-13 DIAGNOSIS — E785 Hyperlipidemia, unspecified: Secondary | ICD-10-CM | POA: Diagnosis not present

## 2014-05-13 DIAGNOSIS — E119 Type 2 diabetes mellitus without complications: Secondary | ICD-10-CM | POA: Diagnosis not present

## 2014-05-13 DIAGNOSIS — Z8709 Personal history of other diseases of the respiratory system: Secondary | ICD-10-CM | POA: Diagnosis not present

## 2014-05-13 DIAGNOSIS — Z86018 Personal history of other benign neoplasm: Secondary | ICD-10-CM | POA: Insufficient documentation

## 2014-05-13 DIAGNOSIS — I10 Essential (primary) hypertension: Secondary | ICD-10-CM | POA: Insufficient documentation

## 2014-05-13 DIAGNOSIS — Z8739 Personal history of other diseases of the musculoskeletal system and connective tissue: Secondary | ICD-10-CM | POA: Diagnosis not present

## 2014-05-13 DIAGNOSIS — Z8719 Personal history of other diseases of the digestive system: Secondary | ICD-10-CM | POA: Insufficient documentation

## 2014-05-13 DIAGNOSIS — Z79899 Other long term (current) drug therapy: Secondary | ICD-10-CM | POA: Diagnosis not present

## 2014-05-13 DIAGNOSIS — R112 Nausea with vomiting, unspecified: Secondary | ICD-10-CM | POA: Diagnosis not present

## 2014-05-13 DIAGNOSIS — R42 Dizziness and giddiness: Secondary | ICD-10-CM | POA: Diagnosis present

## 2014-05-13 HISTORY — DX: Spinal stenosis, lumbar region without neurogenic claudication: M48.061

## 2014-05-13 LAB — COMPREHENSIVE METABOLIC PANEL
ALK PHOS: 64 U/L (ref 39–117)
ALT: 28 U/L (ref 0–35)
ANION GAP: 11 (ref 5–15)
AST: 25 U/L (ref 0–37)
Albumin: 3.9 g/dL (ref 3.5–5.2)
BILIRUBIN TOTAL: 0.4 mg/dL (ref 0.3–1.2)
BUN: 16 mg/dL (ref 6–23)
CHLORIDE: 97 mmol/L (ref 96–112)
CO2: 31 mmol/L (ref 19–32)
Calcium: 9.6 mg/dL (ref 8.4–10.5)
Creatinine, Ser: 0.88 mg/dL (ref 0.50–1.10)
GFR calc non Af Amer: 68 mL/min — ABNORMAL LOW (ref >90)
GFR, EST AFRICAN AMERICAN: 79 mL/min — AB (ref >90)
Glucose, Bld: 155 mg/dL — ABNORMAL HIGH (ref 70–99)
POTASSIUM: 3.7 mmol/L (ref 3.5–5.1)
Sodium: 139 mmol/L (ref 135–145)
Total Protein: 7.3 g/dL (ref 6.0–8.3)

## 2014-05-13 LAB — CBC
HCT: 40.4 % (ref 36.0–46.0)
Hemoglobin: 14.1 g/dL (ref 12.0–15.0)
MCH: 29.6 pg (ref 26.0–34.0)
MCHC: 34.9 g/dL (ref 30.0–36.0)
MCV: 84.9 fL (ref 78.0–100.0)
PLATELETS: 299 10*3/uL (ref 150–400)
RBC: 4.76 MIL/uL (ref 3.87–5.11)
RDW: 13.1 % (ref 11.5–15.5)
WBC: 8.9 10*3/uL (ref 4.0–10.5)

## 2014-05-13 LAB — I-STAT CHEM 8, ED
BUN: 20 mg/dL (ref 6–23)
CHLORIDE: 96 mmol/L (ref 96–112)
Calcium, Ion: 1.17 mmol/L (ref 1.13–1.30)
Creatinine, Ser: 0.8 mg/dL (ref 0.50–1.10)
Glucose, Bld: 156 mg/dL — ABNORMAL HIGH (ref 70–99)
HEMATOCRIT: 45 % (ref 36.0–46.0)
Hemoglobin: 15.3 g/dL — ABNORMAL HIGH (ref 12.0–15.0)
POTASSIUM: 3.7 mmol/L (ref 3.5–5.1)
Sodium: 140 mmol/L (ref 135–145)
TCO2: 28 mmol/L (ref 0–100)

## 2014-05-13 LAB — DIFFERENTIAL
BASOS ABS: 0 10*3/uL (ref 0.0–0.1)
BASOS PCT: 0 % (ref 0–1)
Eosinophils Absolute: 0 10*3/uL (ref 0.0–0.7)
Eosinophils Relative: 0 % (ref 0–5)
Lymphocytes Relative: 12 % (ref 12–46)
Lymphs Abs: 1.1 10*3/uL (ref 0.7–4.0)
Monocytes Absolute: 0.4 10*3/uL (ref 0.1–1.0)
Monocytes Relative: 5 % (ref 3–12)
NEUTROS ABS: 7.4 10*3/uL (ref 1.7–7.7)
Neutrophils Relative %: 83 % — ABNORMAL HIGH (ref 43–77)

## 2014-05-13 LAB — APTT: aPTT: 30 seconds (ref 24–37)

## 2014-05-13 LAB — PROTIME-INR
INR: 0.97 (ref 0.00–1.49)
PROTHROMBIN TIME: 13 s (ref 11.6–15.2)

## 2014-05-13 LAB — CBG MONITORING, ED: GLUCOSE-CAPILLARY: 118 mg/dL — AB (ref 70–99)

## 2014-05-13 LAB — I-STAT TROPONIN, ED: Troponin i, poc: 0 ng/mL (ref 0.00–0.08)

## 2014-05-13 MED ORDER — MECLIZINE HCL 25 MG PO TABS
50.0000 mg | ORAL_TABLET | Freq: Once | ORAL | Status: AC
  Administered 2014-05-13: 50 mg via ORAL
  Filled 2014-05-13: qty 2

## 2014-05-13 MED ORDER — MECLIZINE HCL 50 MG PO TABS: 50.0000 mg | ORAL_TABLET | Freq: Three times a day (TID) | ORAL | Status: DC | PRN

## 2014-05-13 NOTE — Discharge Instructions (Signed)
Please read and follow all provided instructions.  Your diagnoses today include:  1. Dizziness   2. Non-intractable vomiting with nausea, vomiting of unspecified type    Tests performed today include:  MRI brain - no sign of stroke  Vital signs. See below for your results today.   Medications prescribed:   Meclizine - medication for dizziness  Take any prescribed medications only as directed.  Home care instructions:  Follow any educational materials contained in this packet.  Follow-up instructions: Please follow-up with your primary care provider in the next 2 days for further evaluation of your symptoms.   Return instructions:  SEEK IMMEDIATE MEDICAL ATTENTION IF:  There is confusion or drowsiness.  You have more than one episode of vomiting.   You notice dizziness or unsteadiness which is getting worse, or inability to walk.   You have convulsions or unconsciousness.   You experience severe, persistent headaches not relieved by Tylenol.  You cannot use arms or legs normally.   There are changes in pupil sizes. (This is the black center in the colored part of the eye)   There is clear or bloody discharge from the nose or ears.   You have change in speech, vision, swallowing, or understanding.   Localized weakness, numbness, tingling, or change in bowel or bladder control.  You have any other emergent concerns.  Your vital signs today were: BP 134/65 mmHg   Pulse 69   Temp(Src) 98.4 F (36.9 C) (Oral)   Resp 16   Ht 5\' 3"  (1.6 m)   Wt 186 lb (84.369 kg)   BMI 32.96 kg/m2   SpO2 98% If your blood pressure (BP) was elevated above 135/85 this visit, please have this repeated by your doctor within one month. --------------

## 2014-05-13 NOTE — ED Notes (Signed)
Patient still in MRI.  

## 2014-05-13 NOTE — ED Notes (Signed)
Pt woke up this morning at 0410 and felt dizzy and vomited twice. Pt tried to get up again and got sick again. Pt denies any pain in her head during this time but did have a headache yesterday. Still feels nauseated at this time and dizzy when she moves.

## 2014-05-13 NOTE — ED Notes (Signed)
Patient ambulated in hallway steady gait unassisted with one episode 1-2 seconds leaning to the right. States feels a lot better than this morning.

## 2014-05-13 NOTE — ED Notes (Signed)
EKG completed in triage.

## 2014-05-13 NOTE — ED Provider Notes (Signed)
CSN: [id number]     Arrival date & time 05/13/14  1157 History   First MD Initiated Contact with Patient 05/13/14 1301     Chief Complaint  Patient presents with  . Dizziness  . Emesis     (Consider location/radiation/quality/duration/timing/severity/associated sxs/prior Treatment) HPI Comments: Patient with PMH of HTN, HL, DM, pseudotumor cerebri presents to the ED with a chief complaint of dizziness that started this morning while she was saying her morning prayers. Patient states that she felt extremely dizzy when she stood up and became nauseated. She also complains that she had a headache yesterday, but this is not new for her. She does not have a headache now or this morning. She has not taken anything to alleviate her symptoms. Her symptoms are aggravated with going from sitting to standing and with ambulation. Symptoms are not worsened with head movement. She denies any slurred speech, facial droop, or numbness, weakness, or tingling of the extremities.  The history is provided by the patient. No language interpreter was used.    Past Medical History  Diagnosis Date  . Mitral valve prolapse   . PONV (postoperative nausea and vomiting)   . Hypertension   . Hyperlipemia   . Diabetes mellitus without complication   . Bronchitis     hx of  . Headache(784.0)   . Gastritis     Takes Nexium  . Arthritis   . 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 Lumbar stenosis    Past Surgical History  Procedure Laterality Date  . Tonsillectomy    . Appendectomy    . Ankle surgery Right   . Anterior cruciate ligament repair Right   . Lumbar peritoneal shunt    . Cervical spine surgery    . Abdominal hysterectomy    . Lymph node dissection Left   . Vein ligation and stripping Bilateral   . Anterior cervical decomp/discectomy fusion N/A 06/07/2013    Procedure: EXPLORATION OF FUSION AND REMOVAL OF CERVICAL HARDWARE AND ACDF C4-5;  Surgeon: Melina Schools, MD;  Location: [facility];   Service: Orthopedics;  Laterality: N/A;   No family history on file. History  Substance Use Topics  . Smoking status: Never Smoker   . Smokeless tobacco: Not on file  . Alcohol Use: No   OB History    No data available     Review of Systems  Constitutional: Negative for fever and chills.  Respiratory: Negative for shortness of breath.   Cardiovascular: Negative for chest pain.  Gastrointestinal: Negative for nausea, vomiting, diarrhea and constipation.  Genitourinary: Negative for dysuria.  Neurological: Positive for dizziness.  All other systems reviewed and are negative.     Allergies  Metformin; Omeprazole; Prednisone; Rosuvastatin; and Simvastatin  Home Medications   Prior to Admission medications   Medication Sig Start Date End Date Taking? Authorizing Provider  bisoprolol-hydrochlorothiazide (ZIAC) 2.5-6.25 MG per tablet Take 2 tablets by mouth daily.    Historical Provider, MD  calcium-vitamin D (OSCAL WITH D) 500-200 MG-UNIT per tablet Take 1 tablet by mouth daily with breakfast.    Historical Provider, MD  Cinnamon 500 MG TABS Take 500 mg by mouth daily.    Historical Provider, MD  docusate sodium (COLACE) 100 MG capsule Take 1 capsule (100 mg total) by mouth 2 (two) times daily. 06/08/13   Lanae Crumbly, PA-C  esomeprazole (NEXIUM) 20 MG capsule Take 20 mg by mouth daily at 12 noon.    Historical Provider, MD  glyBURIDE micronized (GLYNASE) 3 MG tablet Take 3 mg by mouth 2 (two) times daily with a meal.    Historical Provider, MD  metFORMIN (GLUCOPHAGE-XR) 500 MG 24 hr tablet Take 1,000 mg by mouth 2 (two) times daily.    Historical Provider, MD  methocarbamol (ROBAXIN) 500 MG tablet Take 1 tablet (500 mg total) by mouth every 6 (six) hours as needed for muscle spasms. 06/08/13   Lanae Crumbly, PA-C  ondansetron (ZOFRAN ODT) 4 MG disintegrating tablet Take 1 tablet (4 mg total) by mouth every 8 (eight) hours as needed for nausea or vomiting. 06/08/13   Lanae Crumbly, PA-C   oxyCODONE-acetaminophen (PERCOCET) 10-325 MG per tablet Take 1 tablet by mouth every 6 (six) hours as needed for pain. 06/08/13   Lanae Crumbly, PA-C  polyethylene glycol [facility] / GLYCOLAX) packet Take 17 g by mouth daily. 06/08/13   Lanae Crumbly, PA-C  pravastatin (PRAVACHOL) 40 MG tablet Take 80 mg by mouth at bedtime.    Historical Provider, MD  saxagliptin HCl (ONGLYZA) 5 MG TABS tablet Take 5 mg by mouth at bedtime.    Historical Provider, MD  vitamin B-12 (CYANOCOBALAMIN) 1000 MCG tablet Take 1,000 mcg by mouth daily.    Historical Provider, MD   BP 171/78 mmHg  Pulse 71  Temp(Src) 98.4 F (36.9 C) (Oral)  Resp 20  Ht 5\' 3"  (1.6 m)  Wt 186 lb (84.369 kg)  BMI 32.96 kg/m2  SpO2 97% Physical Exam  Constitutional: She is oriented to person, place, and time. She appears well-developed and well-nourished.  HENT:  Head: Normocephalic and atraumatic.  Right Ear: External ear normal.  Left Ear: External ear normal.  Eyes: Conjunctivae and EOM are normal. Pupils are equal, round, and reactive to light.  Neck: Normal range of motion. Neck supple.  No pain with neck flexion, no meningismus  Cardiovascular: Normal rate, regular rhythm and normal heart sounds.  Exam reveals no gallop and no friction rub.   No murmur heard. Pulmonary/Chest: Effort normal and breath sounds normal. No respiratory distress. She has no wheezes. She has no rales. She exhibits no tenderness.  Abdominal: Soft. Bowel sounds are normal. She exhibits no distension and no mass. There is no tenderness. There is no rebound and no guarding.  Musculoskeletal: Normal range of motion. She exhibits no edema or tenderness.  Unable to stand 2/2 dizziness  Neurological: She is alert and oriented to person, place, and time. She has normal reflexes.  CN 3-12 intact, normal finger to nose, normal heel to shin, no visual field cuts, no pronator drift, sensation and strength intact bilaterally.  Skin: Skin is warm and dry.   Psychiatric: She has a normal mood and affect. Her behavior is normal. Judgment and thought content normal.  Nursing note and vitals reviewed.   ED Course  Procedures (including critical care time) Labs Review Labs Reviewed  PROTIME-INR  APTT  CBC  DIFFERENTIAL  COMPREHENSIVE METABOLIC PANEL  CBG MONITORING, ED  I-STAT CHEM 8, ED  I-STAT TROPOININ, ED    Imaging Review No results found.   EKG Interpretation   Date/Time:  Monday May 13 2014 12:46:44 EDT Ventricular Rate:  67 PR Interval:  132 QRS Duration: 74 QT Interval:  414 QTC Calculation: 437 R Axis:   -16 Text Interpretation:  Normal sinus rhythm Low voltage QRS Borderline ECG  No significant change since last tracing Confirmed by KNAPP  MD-J, JON  ([id number]) on 05/13/2014 12:55:18 PM  MDM   Final diagnoses:  None    Patient with profound dizziness and inability to stand at present because of dizziness.  No reproducible with head movement, but definitely reproducible when patient moves to sitting and then to standing.  No other focal deficits.  1:25 PM Patient seen by and discussed with Dr. Tomi Bamberger.  Recommends not activating code stroke at this time given that symptoms are worsened with movement, but recommends consulting neurology for recommendations.  Will plan on head CT and labs.  Patient discussed with Dr. Leonel Ramsay of neurology.  Recommends not activating code stroke, but recommends getting MRI of brain rather than CT.  If negative, DC with treatment for peripheral vertigo, otherwise consult neurology again.  Patient signed out to [place], [place].   MRI is pending.  Plan for DC with treatment for peripheral vertigo if MR is negative.  However, patient may need admission if unable to ambulate despite negative MRI.  Montine Circle, PA-C 05/13/14 [place], MD 05/13/14 [phone]

## 2014-05-13 NOTE — ED Provider Notes (Signed)
3:06 PM handoff from [facility] at shift change. Patient seen previously by Dr. Tomi Bamberger and Marlon Pel PA-C. Patient is pending an MRI to rule out posterior circulation stroke. She has been given meclizine. Previous team does not feel that this is a complication of pseudotumor cerebri per history. She does not have a significant headache. Dizziness is described as an off-balance feeling.  5:33 PM Patient informed of MRI results. She has ambulated in the hallway without assistance. She states that she feels much better than earlier. Her PCP is Dr. Kenton Kingfisher. I have encouraged follow-up in the next 2 days for a recheck. If symptoms continue, she may need to follow-up with a neurologist regarding her pseudotumor cerebri. She has not seen a neurologist in some time. Do not feel that emergent neurological consultation is needed at this time. Remainder of labs are unremarkable. Gross neuro exam is unremarkable. Will d/c to home with rx for meclizine.    IMPRESSION: No acute infarct.  Mild small vessel disease type changes.  No intracranial hemorrhage.  No intracranial mass lesion noted on this unenhanced exam.  Partially empty sella without significant expansion without secondary findings of pseudotumor. Mild exophthalmos noted.  Mild mucosal thickening ethmoid sinus air cells. Minimal partial opacification mastoid air cells.  Carlisle Cater, PA-C 05/13/14 [place], MD 05/13/14 [phone]

## 2014-08-01 ENCOUNTER — Encounter (HOSPITAL_COMMUNITY): Payer: Self-pay

## 2014-08-01 ENCOUNTER — Encounter (HOSPITAL_COMMUNITY)
Admission: RE | Admit: 2014-08-01 | Discharge: 2014-08-01 | Disposition: A | Discharge status: Home / Self Care | Payer: BLUE CROSS/BLUE SHIELD | Source: Ambulatory Visit | Attending: Orthopedic Surgery | Admitting: Orthopedic Surgery

## 2014-08-01 DIAGNOSIS — Z01812 Encounter for preprocedural laboratory examination: Secondary | ICD-10-CM | POA: Diagnosis not present

## 2014-08-01 DIAGNOSIS — M4806 Spinal stenosis, lumbar region: Secondary | ICD-10-CM | POA: Insufficient documentation

## 2014-08-01 HISTORY — DX: Reserved for inherently not codable concepts without codable children: IMO0001

## 2014-08-01 HISTORY — DX: Unspecified injury of head, initial encounter: S09.90XA

## 2014-08-01 HISTORY — DX: Gastro-esophageal reflux disease without esophagitis: K21.9

## 2014-08-01 HISTORY — DX: Cardiac murmur, unspecified: R01.1

## 2014-08-01 LAB — BASIC METABOLIC PANEL
Anion gap: 11 (ref 5–15)
BUN: 10 mg/dL (ref 6–20)
CALCIUM: 9.3 mg/dL (ref 8.9–10.3)
CHLORIDE: 98 mmol/L — AB (ref 101–111)
CO2: 30 mmol/L (ref 22–32)
Creatinine, Ser: 0.89 mg/dL (ref 0.44–1.00)
GFR calc Af Amer: >60 mL/min (ref >60)
GFR calc non Af Amer: >60 mL/min (ref >60)
GLUCOSE: 86 mg/dL (ref 65–99)
Potassium: 3.3 mmol/L — ABNORMAL LOW (ref 3.5–5.1)
Sodium: 139 mmol/L (ref 135–145)

## 2014-08-01 LAB — CBC
HCT: 39.2 % (ref 36.0–46.0)
Hemoglobin: 13.4 g/dL (ref 12.0–15.0)
MCH: 29.4 pg (ref 26.0–34.0)
MCHC: 34.2 g/dL (ref 30.0–36.0)
MCV: 86 fL (ref 78.0–100.0)
Platelets: 287 10*3/uL (ref 150–400)
RBC: 4.56 MIL/uL (ref 3.87–5.11)
RDW: 13.3 % (ref 11.5–15.5)
WBC: 10.7 10*3/uL — ABNORMAL HIGH (ref 4.0–10.5)

## 2014-08-01 LAB — GLUCOSE, CAPILLARY: Glucose-Capillary: 76 mg/dL (ref 65–99)

## 2014-08-01 NOTE — Progress Notes (Signed)
Mrs Tallie reports having a Mitral Valve Prolaspe, has seen Dr Marlou Porch in past. Follow up not needed.  PCP is Dr Shirline Frees, patient reports that she had an A1C 2 weeks ago, I will request the lab results (patient states it is 5.2)

## 2014-08-01 NOTE — Pre-Procedure Instructions (Signed)
    Laurie Burch  08/01/2014      Your procedure is scheduled on Wednesday, July 6.  Report to [facility] Admitting at 6:30 A.M.   Call this number if you have problems the morning of surgery: [phone]              For any other questions, please call [phone], Monday - Friday 8 AM - 4 PM.   Remember:  Do not eat food or drink liquids after midnight.  Take these medicines the morning of surgery with A SIP OF WATER : bisoprolol-hydrochlorothiazide [facility]).                [name] if needed: Tramadol                Stop taking Aspirin, Coumadin, Plavix, Effient and Herbal medications.  Do not take any NSAIDs ie: Ibuprofen,  Advil,Naproxen or any medication containing Aspirin.   Do not wear jewelry, make-up or nail polish.  Do not wear lotions, powders, or perfumes.    Do not shave 48 hours prior to surgery.    Do not bring valuables to the hospital.  [facility] is not responsible for any belongings or valuables.  Contacts, dentures or bridgework may not be worn into surgery.  Leave your suitcase in the car.  After surgery it may be brought to your room.  For patients admitted to the hospital, discharge time will be determined by your treatment team.  Patients discharged the day of surgery will not be allowed to drive home.   Name and phone number of your driver:  -  Special instructions:  Review  [place] - Preparing For Surgery.  Please read over the following fact sheets that you were given.: Pain Booklet, Coughing and Deep Breathing and Surgical Site Infection Prevention

## 2014-08-02 LAB — SURGICAL PCR SCREEN
MRSA, PCR: POSITIVE — AB
STAPHYLOCOCCUS AUREUS: POSITIVE — AB

## 2014-08-02 NOTE — Progress Notes (Signed)
I called a prescription for Mupirocin ointment to [facility], [address] [employer], [place], [place].

## 2014-08-08 NOTE — Progress Notes (Signed)
Dr Kenton Kingfisher office called and gave results of A1C was 5.7 per office staff member.

## 2014-08-14 ENCOUNTER — Inpatient Hospital Stay (HOSPITAL_COMMUNITY): Payer: BLUE CROSS/BLUE SHIELD

## 2014-08-14 ENCOUNTER — Encounter (HOSPITAL_COMMUNITY)
Admission: RE | Disposition: A | Discharge status: Home / Self Care | Payer: Self-pay | Source: Ambulatory Visit | Attending: Orthopedic Surgery

## 2014-08-14 ENCOUNTER — Encounter (HOSPITAL_COMMUNITY): Payer: Self-pay | Admitting: *Deleted

## 2014-08-14 ENCOUNTER — Inpatient Hospital Stay (HOSPITAL_COMMUNITY): Payer: BLUE CROSS/BLUE SHIELD | Admitting: Anesthesiology

## 2014-08-14 ENCOUNTER — Inpatient Hospital Stay (HOSPITAL_COMMUNITY)
Admission: RE | Admit: 2014-08-14 | Discharge: 2014-08-15 | DRG: 520 | Disposition: A | Discharge status: Home / Self Care | Payer: BLUE CROSS/BLUE SHIELD | Source: Ambulatory Visit | Attending: Orthopedic Surgery | Admitting: Orthopedic Surgery

## 2014-08-14 DIAGNOSIS — M5126 Other intervertebral disc displacement, lumbar region: Secondary | ICD-10-CM | POA: Diagnosis present

## 2014-08-14 DIAGNOSIS — E119 Type 2 diabetes mellitus without complications: Secondary | ICD-10-CM | POA: Diagnosis present

## 2014-08-14 DIAGNOSIS — K219 Gastro-esophageal reflux disease without esophagitis: Secondary | ICD-10-CM | POA: Diagnosis present

## 2014-08-14 DIAGNOSIS — Z823 Family history of stroke: Secondary | ICD-10-CM

## 2014-08-14 DIAGNOSIS — M48062 Spinal stenosis, lumbar region with neurogenic claudication: Secondary | ICD-10-CM | POA: Diagnosis present

## 2014-08-14 DIAGNOSIS — Z8261 Family history of arthritis: Secondary | ICD-10-CM

## 2014-08-14 DIAGNOSIS — Z79899 Other long term (current) drug therapy: Secondary | ICD-10-CM

## 2014-08-14 DIAGNOSIS — M4806 Spinal stenosis, lumbar region: Secondary | ICD-10-CM | POA: Diagnosis present

## 2014-08-14 DIAGNOSIS — E785 Hyperlipidemia, unspecified: Secondary | ICD-10-CM | POA: Diagnosis present

## 2014-08-14 DIAGNOSIS — Z818 Family history of other mental and behavioral disorders: Secondary | ICD-10-CM | POA: Diagnosis not present

## 2014-08-14 DIAGNOSIS — Z419 Encounter for procedure for purposes other than remedying health state, unspecified: Secondary | ICD-10-CM

## 2014-08-14 DIAGNOSIS — E780 Pure hypercholesterolemia: Secondary | ICD-10-CM | POA: Diagnosis present

## 2014-08-14 DIAGNOSIS — Z8249 Family history of ischemic heart disease and other diseases of the circulatory system: Secondary | ICD-10-CM | POA: Diagnosis not present

## 2014-08-14 DIAGNOSIS — Z833 Family history of diabetes mellitus: Secondary | ICD-10-CM | POA: Diagnosis not present

## 2014-08-14 DIAGNOSIS — Z981 Arthrodesis status: Secondary | ICD-10-CM | POA: Diagnosis not present

## 2014-08-14 DIAGNOSIS — I10 Essential (primary) hypertension: Secondary | ICD-10-CM | POA: Diagnosis present

## 2014-08-14 HISTORY — PX: LUMBAR LAMINECTOMY/DECOMPRESSION MICRODISCECTOMY: SHX5026

## 2014-08-14 LAB — GLUCOSE, CAPILLARY
GLUCOSE-CAPILLARY: 133 mg/dL — AB (ref 65–99)
GLUCOSE-CAPILLARY: 93 mg/dL (ref 65–99)
GLUCOSE-CAPILLARY: 149 mg/dL — AB (ref 65–99)
Glucose-Capillary: 121 mg/dL — ABNORMAL HIGH (ref 65–99)

## 2014-08-14 SURGERY — LUMBAR LAMINECTOMY/DECOMPRESSION MICRODISCECTOMY 2 LEVELS
Anesthesia: General | Site: Back

## 2014-08-14 MED ORDER — HEMOSTATIC AGENTS (NO CHARGE) OPTIME
TOPICAL | Status: DC | PRN
  Administered 2014-08-14: 1 via TOPICAL

## 2014-08-14 MED ORDER — FENTANYL CITRATE (PF) 100 MCG/2ML IJ SOLN
INTRAMUSCULAR | Status: DC | PRN
  Administered 2014-08-14: 50 ug via INTRAVENOUS
  Administered 2014-08-14 (×2): 25 ug via INTRAVENOUS
  Administered 2014-08-14: 50 ug via INTRAVENOUS

## 2014-08-14 MED ORDER — ACETAMINOPHEN 10 MG/ML IV SOLN
1000.0000 mg | Freq: Four times a day (QID) | INTRAVENOUS | Status: AC
  Administered 2014-08-14 (×3): 1000 mg via INTRAVENOUS
  Filled 2014-08-14 (×3): qty 100

## 2014-08-14 MED ORDER — ONDANSETRON HCL 4 MG/2ML IJ SOLN
INTRAMUSCULAR | Status: AC
  Filled 2014-08-14: qty 2

## 2014-08-14 MED ORDER — ONDANSETRON HCL 4 MG/2ML IJ SOLN: 4.0000 mg | INTRAMUSCULAR | Status: DC | PRN

## 2014-08-14 MED ORDER — LACTATED RINGERS IV SOLN: INTRAVENOUS | Status: DC

## 2014-08-14 MED ORDER — CEFAZOLIN SODIUM 1-5 GM-% IV SOLN
1.0000 g | Freq: Three times a day (TID) | INTRAVENOUS | Status: AC
  Administered 2014-08-14 (×2): 1 g via INTRAVENOUS
  Filled 2014-08-14 (×2): qty 50

## 2014-08-14 MED ORDER — NEOSTIGMINE METHYLSULFATE 10 MG/10ML IV SOLN
INTRAVENOUS | Status: AC
  Filled 2014-08-14: qty 1

## 2014-08-14 MED ORDER — ONDANSETRON HCL 4 MG/2ML IJ SOLN
INTRAMUSCULAR | Status: DC | PRN
  Administered 2014-08-14: 4 mg via INTRAVENOUS

## 2014-08-14 MED ORDER — PHENOL 1.4 % MT LIQD: 1.0000 | OROMUCOSAL | Status: DC | PRN

## 2014-08-14 MED ORDER — MORPHINE SULFATE 2 MG/ML IJ SOLN
1.0000 mg | INTRAMUSCULAR | Status: DC | PRN
  Administered 2014-08-14: 4 mg via INTRAVENOUS
  Filled 2014-08-14: qty 2

## 2014-08-14 MED ORDER — ACETAMINOPHEN 10 MG/ML IV SOLN
INTRAVENOUS | Status: AC
  Administered 2014-08-14: 1000 mg via INTRAVENOUS
  Filled 2014-08-14: qty 100

## 2014-08-14 MED ORDER — PROPOFOL 10 MG/ML IV BOLUS
INTRAVENOUS | Status: AC
Start: 2014-08-14 — End: 2014-08-14
  Filled 2014-08-14: qty 20

## 2014-08-14 MED ORDER — METHOCARBAMOL 500 MG PO TABS
ORAL_TABLET | ORAL | Status: AC
Start: 2014-08-14 — End: 2014-08-14
  Filled 2014-08-14: qty 1

## 2014-08-14 MED ORDER — DEXTROSE 5 % IV SOLN
INTRAVENOUS | Status: DC | PRN
  Administered 2014-08-14: 09:00:00 via INTRAVENOUS

## 2014-08-14 MED ORDER — NEOSTIGMINE METHYLSULFATE 10 MG/10ML IV SOLN
INTRAVENOUS | Status: DC | PRN
  Administered 2014-08-14 (×2): 2 mg via INTRAVENOUS

## 2014-08-14 MED ORDER — METHOCARBAMOL 1000 MG/10ML IJ SOLN: 500.0000 mg | Freq: Four times a day (QID) | INTRAVENOUS | Status: DC | PRN

## 2014-08-14 MED ORDER — PHENYLEPHRINE 40 MCG/ML (10ML) SYRINGE FOR IV PUSH (FOR BLOOD PRESSURE SUPPORT)
PREFILLED_SYRINGE | INTRAVENOUS | Status: AC
  Filled 2014-08-14: qty 10

## 2014-08-14 MED ORDER — ONDANSETRON HCL 4 MG PO TABS: 4.0000 mg | ORAL_TABLET | Freq: Three times a day (TID) | ORAL | Status: DC | PRN

## 2014-08-14 MED ORDER — HYDROMORPHONE HCL 1 MG/ML IJ SOLN
0.2500 mg | INTRAMUSCULAR | Status: DC | PRN
  Administered 2014-08-14 (×4): 0.5 mg via INTRAVENOUS

## 2014-08-14 MED ORDER — PRAVASTATIN SODIUM 80 MG PO TABS
80.0000 mg | ORAL_TABLET | Freq: Every day | ORAL | Status: DC
  Administered 2014-08-14: 80 mg via ORAL
  Filled 2014-08-14 (×2): qty 1

## 2014-08-14 MED ORDER — METHOCARBAMOL 500 MG PO TABS
500.0000 mg | ORAL_TABLET | Freq: Four times a day (QID) | ORAL | Status: DC | PRN
  Administered 2014-08-14 – 2014-08-15 (×4): 500 mg via ORAL
  Filled 2014-08-14 (×3): qty 1

## 2014-08-14 MED ORDER — LINAGLIPTIN 5 MG PO TABS
5.0000 mg | ORAL_TABLET | Freq: Every day | ORAL | Status: DC
  Administered 2014-08-15: 5 mg via ORAL
  Filled 2014-08-14: qty 1

## 2014-08-14 MED ORDER — BUPIVACAINE-EPINEPHRINE (PF) 0.25% -1:200000 IJ SOLN
INTRAMUSCULAR | Status: AC
  Filled 2014-08-14: qty 30

## 2014-08-14 MED ORDER — LIDOCAINE HCL (CARDIAC) 20 MG/ML IV SOLN
INTRAVENOUS | Status: DC | PRN
  Administered 2014-08-14: 80 mg via INTRAVENOUS

## 2014-08-14 MED ORDER — OXYCODONE HCL 5 MG PO TABS
10.0000 mg | ORAL_TABLET | ORAL | Status: DC | PRN
  Administered 2014-08-14 – 2014-08-15 (×7): 10 mg via ORAL
  Filled 2014-08-14 (×6): qty 2

## 2014-08-14 MED ORDER — GLYBURIDE MICRONIZED 3 MG PO TABS
6.0000 mg | ORAL_TABLET | Freq: Two times a day (BID) | ORAL | Status: DC
  Administered 2014-08-14 – 2014-08-15 (×2): 6 mg via ORAL
  Filled 2014-08-14 (×4): qty 2

## 2014-08-14 MED ORDER — FENTANYL CITRATE (PF) 250 MCG/5ML IJ SOLN
INTRAMUSCULAR | Status: AC
  Filled 2014-08-14: qty 5

## 2014-08-14 MED ORDER — SODIUM CHLORIDE 0.9 % IJ SOLN: 3.0000 mL | Freq: Two times a day (BID) | INTRAMUSCULAR | Status: DC

## 2014-08-14 MED ORDER — THROMBIN 20000 UNITS EX SOLR
OROMUCOSAL | Status: DC | PRN
  Administered 2014-08-14: 20 mL via TOPICAL

## 2014-08-14 MED ORDER — MIDAZOLAM HCL 5 MG/5ML IJ SOLN
INTRAMUSCULAR | Status: DC | PRN
  Administered 2014-08-14 (×3): 0.5 mg via INTRAVENOUS

## 2014-08-14 MED ORDER — PHENYLEPHRINE HCL 10 MG/ML IJ SOLN
INTRAMUSCULAR | Status: DC | PRN
  Administered 2014-08-14 (×2): 40 ug via INTRAVENOUS
  Administered 2014-08-14: 80 ug via INTRAVENOUS
  Administered 2014-08-14 (×2): 40 ug via INTRAVENOUS

## 2014-08-14 MED ORDER — METFORMIN HCL ER 500 MG PO TB24
1000.0000 mg | ORAL_TABLET | Freq: Two times a day (BID) | ORAL | Status: DC
  Administered 2014-08-14 – 2014-08-15 (×2): 1000 mg via ORAL
  Filled 2014-08-14 (×4): qty 2

## 2014-08-14 MED ORDER — HYDROMORPHONE HCL 1 MG/ML IJ SOLN
INTRAMUSCULAR | Status: AC
  Administered 2014-08-14: 0.5 mg via INTRAVENOUS
  Filled 2014-08-14: qty 2

## 2014-08-14 MED ORDER — BISOPROLOL-HYDROCHLOROTHIAZIDE 2.5-6.25 MG PO TABS
2.0000 | ORAL_TABLET | Freq: Every day | ORAL | Status: DC
  Administered 2014-08-15: 2 via ORAL
  Filled 2014-08-14: qty 2

## 2014-08-14 MED ORDER — PROPOFOL 10 MG/ML IV BOLUS
INTRAVENOUS | Status: DC | PRN
  Administered 2014-08-14: 200 mg via INTRAVENOUS

## 2014-08-14 MED ORDER — MENTHOL 3 MG MT LOZG: 1.0000 | LOZENGE | OROMUCOSAL | Status: DC | PRN

## 2014-08-14 MED ORDER — LACTATED RINGERS IV SOLN
INTRAVENOUS | Status: DC | PRN
  Administered 2014-08-14 (×2): via INTRAVENOUS

## 2014-08-14 MED ORDER — BUPIVACAINE-EPINEPHRINE (PF) 0.25% -1:200000 IJ SOLN
INTRAMUSCULAR | Status: DC | PRN
  Administered 2014-08-14: 10 mL

## 2014-08-14 MED ORDER — THROMBIN 20000 UNITS EX SOLR
CUTANEOUS | Status: AC
  Filled 2014-08-14: qty 20000

## 2014-08-14 MED ORDER — INSULIN ASPART 100 UNIT/ML ~~LOC~~ SOLN: 0.0000 [IU] | Freq: Three times a day (TID) | SUBCUTANEOUS | Status: DC

## 2014-08-14 MED ORDER — OXYCODONE-ACETAMINOPHEN 10-325 MG PO TABS: 1.0000 | ORAL_TABLET | ORAL | Status: DC | PRN

## 2014-08-14 MED ORDER — LIDOCAINE HCL (CARDIAC) 20 MG/ML IV SOLN
INTRAVENOUS | Status: AC
  Filled 2014-08-14: qty 5

## 2014-08-14 MED ORDER — ALBUTEROL SULFATE (2.5 MG/3ML) 0.083% IN NEBU: 2.5000 mg | INHALATION_SOLUTION | Freq: Four times a day (QID) | RESPIRATORY_TRACT | Status: DC | PRN

## 2014-08-14 MED ORDER — PROPOFOL 10 MG/ML IV BOLUS
INTRAVENOUS | Status: AC
  Filled 2014-08-14: qty 20

## 2014-08-14 MED ORDER — GLYCOPYRROLATE 0.2 MG/ML IJ SOLN
INTRAMUSCULAR | Status: AC
  Filled 2014-08-14: qty 3

## 2014-08-14 MED ORDER — FLEET ENEMA 7-19 GM/118ML RE ENEM
1.0000 | ENEMA | Freq: Once | RECTAL | Status: AC
  Administered 2014-08-15: 1 via RECTAL
  Filled 2014-08-14: qty 1

## 2014-08-14 MED ORDER — ROCURONIUM BROMIDE 100 MG/10ML IV SOLN
INTRAVENOUS | Status: DC | PRN
  Administered 2014-08-14: 50 mg via INTRAVENOUS

## 2014-08-14 MED ORDER — ARTIFICIAL TEARS OP OINT
TOPICAL_OINTMENT | OPHTHALMIC | Status: AC
  Filled 2014-08-14: qty 3.5

## 2014-08-14 MED ORDER — INSULIN ASPART 100 UNIT/ML ~~LOC~~ SOLN: 0.0000 [IU] | SUBCUTANEOUS | Status: DC

## 2014-08-14 MED ORDER — ROCURONIUM BROMIDE 50 MG/5ML IV SOLN
INTRAVENOUS | Status: AC
  Filled 2014-08-14: qty 1

## 2014-08-14 MED ORDER — SODIUM CHLORIDE 0.9 % IJ SOLN: 3.0000 mL | INTRAMUSCULAR | Status: DC | PRN

## 2014-08-14 MED ORDER — CHLORHEXIDINE GLUCONATE CLOTH 2 % EX PADS
6.0000 | MEDICATED_PAD | Freq: Every day | CUTANEOUS | Status: DC
  Administered 2014-08-15: 6 via TOPICAL

## 2014-08-14 MED ORDER — DEXTROSE 5 % IV SOLN
10.0000 mg | INTRAVENOUS | Status: DC | PRN
  Administered 2014-08-14: 40 ug/min via INTRAVENOUS

## 2014-08-14 MED ORDER — 0.9 % SODIUM CHLORIDE (POUR BTL) OPTIME
TOPICAL | Status: DC | PRN
  Administered 2014-08-14: 1000 mL

## 2014-08-14 MED ORDER — GLYCOPYRROLATE 0.2 MG/ML IJ SOLN
INTRAMUSCULAR | Status: DC | PRN
  Administered 2014-08-14 (×2): 0.3 mg via INTRAVENOUS

## 2014-08-14 MED ORDER — ALBUTEROL SULFATE HFA 108 (90 BASE) MCG/ACT IN AERS: 2.0000 | INHALATION_SPRAY | Freq: Four times a day (QID) | RESPIRATORY_TRACT | Status: DC | PRN

## 2014-08-14 MED ORDER — MAGNESIUM CITRATE PO SOLN
0.5000 | Freq: Once | ORAL | Status: AC
  Administered 2014-08-14: 0.5 via ORAL
  Filled 2014-08-14: qty 296

## 2014-08-14 MED ORDER — MORPHINE SULFATE 4 MG/ML IJ SOLN
INTRAMUSCULAR | Status: AC
Start: 2014-08-14 — End: 2014-08-14
  Administered 2014-08-14: 4 mg
  Filled 2014-08-14: qty 1

## 2014-08-14 MED ORDER — CEFAZOLIN SODIUM-DEXTROSE 2-3 GM-% IV SOLR
2.0000 g | INTRAVENOUS | Status: AC
Start: 2014-08-14 — End: 2014-08-14
  Administered 2014-08-14: 2 g via INTRAVENOUS

## 2014-08-14 MED ORDER — SODIUM CHLORIDE 0.9 % IV SOLN: 250.0000 mL | INTRAVENOUS | Status: DC

## 2014-08-14 MED ORDER — MIDAZOLAM HCL 2 MG/2ML IJ SOLN
INTRAMUSCULAR | Status: AC
  Filled 2014-08-14: qty 2

## 2014-08-14 MED ORDER — METHOCARBAMOL 500 MG PO TABS: 500.0000 mg | ORAL_TABLET | Freq: Three times a day (TID) | ORAL | Status: DC | PRN

## 2014-08-14 MED ORDER — INSULIN ASPART 100 UNIT/ML ~~LOC~~ SOLN: 0.0000 [IU] | Freq: Every day | SUBCUTANEOUS | Status: DC

## 2014-08-14 MED ORDER — OXYCODONE HCL 5 MG PO TABS
ORAL_TABLET | ORAL | Status: AC
  Filled 2014-08-14: qty 2

## 2014-08-14 SURGICAL SUPPLY — 60 items
BNDG GAUZE ELAST 4 BULKY (GAUZE/BANDAGES/DRESSINGS) ×3 IMPLANT
BUR EGG ELITE 4.0 (BURR) IMPLANT
BUR EGG ELITE 4.0MM (BURR)
CLOSURE STERI-STRIP 1/2X4 (GAUZE/BANDAGES/DRESSINGS) ×1
CLOSURE WOUND 1/2 X4 (GAUZE/BANDAGES/DRESSINGS)
CLSR STERI-STRIP ANTIMIC 1/2X4 (GAUZE/BANDAGES/DRESSINGS) ×2 IMPLANT
CORDS BIPOLAR (ELECTRODE) ×3 IMPLANT
DRAPE C-ARM 42X72 X-RAY (DRAPES) IMPLANT
DRAPE POUCH INSTRU U-SHP 10X18 (DRAPES) ×3 IMPLANT
DRAPE SURG 17X11 SM STRL (DRAPES) ×3 IMPLANT
DRAPE U-SHAPE 47X51 STRL (DRAPES) ×3 IMPLANT
DRSG MEPILEX BORDER 4X4 (GAUZE/BANDAGES/DRESSINGS) IMPLANT
DRSG MEPILEX BORDER 4X8 (GAUZE/BANDAGES/DRESSINGS) ×3 IMPLANT
DURAPREP 26ML APPLICATOR (WOUND CARE) ×3 IMPLANT
ELECT BLADE 4.0 EZ CLEAN MEGAD (MISCELLANEOUS)
ELECT CAUTERY BLADE 6.4 (BLADE) ×3 IMPLANT
ELECT PENCIL ROCKER SW 15FT (MISCELLANEOUS) ×3 IMPLANT
ELECT REM PT RETURN 9FT ADLT (ELECTROSURGICAL) ×3
ELECTRODE BLDE 4.0 EZ CLN MEGD (MISCELLANEOUS) IMPLANT
ELECTRODE REM PT RTRN 9FT ADLT (ELECTROSURGICAL) ×1 IMPLANT
FLOSEAL (HEMOSTASIS) ×3 IMPLANT
GLOVE BIOGEL PI IND STRL 8 (GLOVE) ×1 IMPLANT
GLOVE BIOGEL PI IND STRL 8.5 (GLOVE) ×1 IMPLANT
GLOVE BIOGEL PI INDICATOR 8 (GLOVE) ×2
GLOVE BIOGEL PI INDICATOR 8.5 (GLOVE) ×2
GLOVE ORTHO TXT STRL SZ7.5 (GLOVE) ×3 IMPLANT
GLOVE SS BIOGEL STRL SZ 8.5 (GLOVE) ×1 IMPLANT
GLOVE SUPERSENSE BIOGEL SZ 8.5 (GLOVE) ×2
GOWN STRL REUS W/ TWL LRG LVL3 (GOWN DISPOSABLE) ×1 IMPLANT
GOWN STRL REUS W/TWL 2XL LVL3 (GOWN DISPOSABLE) ×6 IMPLANT
GOWN STRL REUS W/TWL LRG LVL3 (GOWN DISPOSABLE) ×2
KIT BASIN OR (CUSTOM PROCEDURE TRAY) ×3 IMPLANT
NEEDLE 22X1 1/2 (OR ONLY) (NEEDLE) ×3 IMPLANT
NEEDLE SPNL 18GX3.5 QUINCKE PK (NEEDLE) ×6 IMPLANT
NS IRRIG 1000ML POUR BTL (IV SOLUTION) ×3 IMPLANT
PACK LAMINECTOMY ORTHO (CUSTOM PROCEDURE TRAY) ×3 IMPLANT
PACK UNIVERSAL I (CUSTOM PROCEDURE TRAY) ×3 IMPLANT
PATTIES SURGICAL .5 X.5 (GAUZE/BANDAGES/DRESSINGS) ×3 IMPLANT
PATTIES SURGICAL .5 X1 (DISPOSABLE) ×6 IMPLANT
SPONGE LAP 4X18 X RAY DECT (DISPOSABLE) ×6 IMPLANT
SPONGE SURGIFOAM ABS GEL 100 (HEMOSTASIS) ×3 IMPLANT
STAPLER VISISTAT 35W (STAPLE) IMPLANT
STRIP CLOSURE SKIN 1/2X4 (GAUZE/BANDAGES/DRESSINGS) IMPLANT
SURGIFLO TRUKIT (HEMOSTASIS) IMPLANT
SUT BONE WAX W31G (SUTURE) ×3 IMPLANT
SUT MON AB 3-0 SH 27 (SUTURE) ×2
SUT MON AB 3-0 SH27 (SUTURE) ×1 IMPLANT
SUT VIC AB 1 CT1 18XCR BRD 8 (SUTURE) ×1 IMPLANT
SUT VIC AB 1 CT1 27 (SUTURE) ×2
SUT VIC AB 1 CT1 27XBRD ANBCTR (SUTURE) ×1 IMPLANT
SUT VIC AB 1 CT1 27XBRD ANTBC (SUTURE) IMPLANT
SUT VIC AB 1 CT1 8-18 (SUTURE) ×2
SUT VIC AB 2-0 CT1 18 (SUTURE) ×6 IMPLANT
SUT VICRYL 0 UR6 27IN ABS (SUTURE) IMPLANT
SYR BULB IRRIGATION 50ML (SYRINGE) ×3 IMPLANT
SYR CONTROL 10ML LL (SYRINGE) ×3 IMPLANT
TOWEL OR 17X26 10 PK STRL BLUE (TOWEL DISPOSABLE) ×6 IMPLANT
TRAY FOLEY CATH 16FRSI W/METER (SET/KITS/TRAYS/PACK) IMPLANT
WATER STERILE IRR 1000ML POUR (IV SOLUTION) IMPLANT
YANKAUER SUCT BULB TIP NO VENT (SUCTIONS) ×3 IMPLANT

## 2014-08-14 NOTE — Transfer of Care (Signed)
Immediate Anesthesia Transfer of Care Note  Patient: Laurie Burch  Procedure(s) Performed: Procedure(s): LUMBAR DECOMPRESSION L3-5, LEFT SIDED DISCECTOMY L3-4 (N/A)  Patient Location: PACU  Anesthesia Type:General  Level of Consciousness: awake and patient cooperative  Airway & Oxygen Therapy: Patient Spontanous Breathing and Patient connected to nasal cannula oxygen  Post-op Assessment: Report given to RN, Post -op Vital signs reviewed and stable and Patient moving all extremities  Post vital signs: Reviewed and stable  Last Vitals:  Filed Vitals:   08/14/14 0705  BP: 157/83  Pulse: 68  Temp: 36.9 C  Resp: 20    Complications: No apparent anesthesia complications

## 2014-08-14 NOTE — Anesthesia Procedure Notes (Signed)
Procedure Name: Intubation Date/Time: 08/14/2014 8:38 AM Performed by: Terrill Mohr Pre-anesthesia Checklist: Patient identified, Emergency Drugs available, Suction available and Patient being monitored Patient Re-evaluated:Patient Re-evaluated prior to inductionOxygen Delivery Method: Circle system utilized Preoxygenation: Pre-oxygenation with 100% oxygen Intubation Type: IV induction Ventilation: Mask ventilation without difficulty Laryngoscope Size: Mac and 3 Grade View: Grade III Tube type: Oral Tube size: 7.5 mm Number of attempts: 1 Airway Equipment and Method: Stylet Placement Confirmation: breath sounds checked- equal and bilateral,  ETT inserted through vocal cords under direct vision and positive ETCO2 (saw only lower third of cords) Secured at: 21 (cm at teeth) cm Tube secured with: Tape Dental Injury: Teeth and Oropharynx as per pre-operative assessment

## 2014-08-14 NOTE — H&P (Signed)
History of Present Illness  The patient is a 64 year old female who comes in today for a preoperative History and Physical. The patient is scheduled for a lumbar decompression with Coflex insertion L3-4, L4-5 to be performed by Dr. Duane Lope D. Rolena Infante, MD at [facility] on 08-14-14 . Please see the hospital record for complete dictated history and physical. The pt has DM type 2. Her last A1c was 5.7. She is on oral medication and her PCP is pleased with her Bs control. the pt does not smoke.  Allergies  PredniSONE (Pak) *CORTICOSTEROIDS*  Family History  Heart Disease Father, Mother. Hypertension Brother, Father, Mother. Rheumatoid Arthritis Father. Cerebrovascular Accident Father. Depression Mother. Diabetes Mellitus Brother, Father, Mother. First Degree Relatives  Social History  Not under pain contract Current work status working full time Number of flights of stairs before winded 1 No history of drug/alcohol rehab Exercise Exercises weekly; does running / walking Never consumed alcohol 03/28/2013: Never consumed alcohol Tobacco / smoke exposure 03/28/2013: no Tobacco use Never smoker. 03/28/2013 Marital status married Children 1 Living situation live with spouse  Medication History Januvia (50MG  Tablet, Oral) Active. (qd) Iron (66MG  Tablet, Oral) Active. MetFORMIN HCl ER (500MG  Tablet ER 24HR, Oral) Active. (bid) GlyBURIDE Micronized (3MG  Tablet, Oral) Active. (bid) Pravastatin Sodium (40MG  Tablet, Oral) Active. (qd) Bisoprolol-Hydrochlorothiazide (2.5-6.25MG  Tablet, Oral) Active. (qd) One-A-Day 55 Plus (Oral) Active. (qd) Cinnamon (500MG  Tablet, Oral) Active. (qd) Vitamin E (400UNIT Tablet, Oral) Active. (qd) Vitamin B-6 (100MG  Tablet, Oral) Active. (qd) Aspirin (81MG  Tablet, 1 (one) Oral) Active. (qd) TraMADol HCl (50MG  Tablet, 1 (one) Tablet Oral tid prn, Taken starting 04/08/2014) Inactive. (rx called to [facility] [address] [phone]  ddb/smt 04/08/14) Medications Reconciled  Past Surgical History ORIF right ankle Appendectomy Cesarean Section - 1 Myomectomy Lumbar Peritoneal Shunt Tonsillectomy/Adenoidectomy Synovectomy Hysterectomy (not due to cancer) - Complete Vericose veins removed from right leg Spinal Fusion - Neck Arthroscopic Knee Surgery - Right  Other Problems  Diabetes Mellitus, Type II Gastroesophageal Reflux Disease Heart murmur High blood pressure Hypercholesterolemia Mitral Valve Prolapse Pseudo-tumor Cerebral  Vitals  07/26/2014 2:10 PM Weight: 186 lb Height: 63in Body Surface Area: 1.88 m Body Mass Index: 32.95 kg/m  Temp.: 98.53F(Oral)  BP: 134/99 (Sitting, Left Arm, Standard)     Physical Exam  General General Appearance-Not in acute distress. Orientation-Oriented X3. Build & Nutrition-Well nourished and Well developed.  Integumentary General Characteristics Surgical Scars - no surgical scar evidence of previous lumbar surgery. Lumbar Spine-Skin examination of the lumbar spine is without deformity, skin lesions, lacerations or abrasions.  Chest and Lung Exam Auscultation Breath sounds - Normal and Clear.  Cardiovascular Auscultation Rhythm - Regular rate and rhythm.  Abdomen Palpation/Percussion Palpation and Percussion of the abdomen reveal - Soft, Non Tender and No Rebound tenderness.  Peripheral Vascular Lower Extremity Palpation - Posterior tibial pulse - Bilateral - 2+. Dorsalis pedis pulse - Bilateral - 2+.  Neurologic Sensation Lower Extremity - Bilateral - sensation is intact in the lower extremity. Reflexes Patellar Reflex - Bilateral - 2+. Achilles Reflex - Bilateral - 2+. Clonus - Bilateral - clonus not present. Hoffman's Sign - Bilateral - Hoffman's sign not present. Testing Seated Straight Leg Raise - Left - Seated straight leg raise positive. Right - Seated straight leg raise  negative.  Musculoskeletal Spine/Ribs/Pelvis  Lumbosacral Spine: Inspection and Palpation - Tenderness - left lumbar paraspinals tender to palpation and right lumbar paraspinals tender to palpation. Strength and Tone: Strength - Hip Flexion - Bilateral - 5/5. Knee  Extension - Bilateral - 5/5. Knee Flexion - Bilateral - 5/5. Ankle Dorsiflexion - Bilateral - 5/5. Ankle Plantarflexion - Bilateral - 5/5. Heel walk - Bilateral - able to heel walk without difficulty. Toe Walk - Bilateral - able to walk on toes without difficulty. Heel-Toe Walk - Bilateral - able to heel-toe walk without difficulty. ROM - Flexion - moderately decreased range of motion. Extension - moderately decreased range of motion and painful. Pain - extension is more painful than flexion. Lumbosacral Spine - Waddell's Signs - no Waddell's signs present. Lower Extremity Range of Motion - No true hip, knee or ankle pain with range of motion. Gait and Station - [employer] - no assistive devices.  MRI:   She has an old T12 compression fracture that does not appear to be new. There is no edema. She has mild canal stenosis and foraminal stenosis at 2-3, severe left lateral recess and foraminal stenosis and central stenosis at 3-4 and 4-5. There is mild to moderate left foraminal stenosis at 5-1. There is mention of a low signal intensity along the cauda equina at the tip of the conus and I agree that I think it represents the nerve roots on end. I do not think it represents PNST's. She has no cauda equina symptoms Assessment & Plan  At this point in time, we have had a long discussion about her surgery. Because of insurance approval issues and recent issues with Coflex's coding, I have elected to just remove that from her procedure. We will still move forward with addressing the spinal stenosis, but this time with just a straightforward lumbar laminotomy and laminectomy at L3 to S1. This will still allow me to decompress the neural  elements to help resolve the neurogenic claudication pain that she is having. The risks are still infection, bleeding, nerve damage, death, stroke, paralysis, failure to heal, need for further surgery, ongoing or worse pain, loss in bowel and bladder control, leak of spinal fluid, need for further surgery. All of her questions were encouraged and addressed. We will be planning on surgery on July 6.

## 2014-08-14 NOTE — Anesthesia Preprocedure Evaluation (Addendum)
Anesthesia Evaluation  Patient identified by MRN, date of birth, ID band Patient awake    Reviewed: Allergy & Precautions, H&P , NPO status , Patient's Chart, lab work & pertinent test results, reviewed documented beta blocker date and time   History of Anesthesia Complications (+) PONV  Airway Mallampati: III  TM Distance: >3 FB Neck ROM: Full    Dental no notable dental hx. (+) Teeth Intact, Dental Advisory Given, Caps   Pulmonary neg pulmonary ROS,  breath sounds clear to auscultation  Pulmonary exam normal       Cardiovascular hypertension, Pt. on medications and Pt. on home beta blockers + Valvular Problems/Murmurs MVP Rhythm:Regular Rate:Normal     Neuro/Psych  Headaches, negative psych ROS   GI/Hepatic Neg liver ROS, GERD-  Medicated and Controlled,  Endo/Other  diabetes, Well Controlled, Type 2, Oral Hypoglycemic Agents  Renal/GU negative Renal ROS  negative genitourinary   Musculoskeletal  (+) Arthritis -, Osteoarthritis,    Abdominal   Peds  Hematology negative hematology ROS (+)   Anesthesia Other Findings   Reproductive/Obstetrics negative OB ROS                           Anesthesia Physical Anesthesia Plan  ASA: II  Anesthesia Plan: General   Post-op Pain Management:    Induction: Intravenous  Airway Management Planned: Oral ETT  Additional Equipment:   Intra-op Plan:   Post-operative Plan: Extubation in OR  Informed Consent: I have reviewed the patients History and Physical, chart, labs and discussed the procedure including the risks, benefits and alternatives for the proposed anesthesia with the patient or authorized representative who has indicated his/her understanding and acceptance.   Dental advisory given  Plan Discussed with: CRNA  Anesthesia Plan Comments:         Anesthesia Quick Evaluation

## 2014-08-14 NOTE — Evaluation (Signed)
Physical Therapy Evaluation Patient Details Name: Laurie Burch MRN: [id number] DOB: [birth year] Today's Date: 08/14/2014   History of Present Illness  Pt is 64 y/o female admitted with pain radiating down L LE, s/p L3-5 decompression and L 3-4 discectomy.  Clinical Impression  Pt admitted with/for lumbar decompression surgery.  Pt currently limited functionally due to the problems listed below.  (see problems list.)  Pt will benefit from PT to maximize function and safety to be able to get home safely with available assist of family.     Follow Up Recommendations No PT follow up    Equipment Recommendations  None recommended by PT    Recommendations for Other Services       Precautions / Restrictions Precautions Precautions: Back      Mobility  Bed Mobility Overal bed mobility: Needs Assistance Bed Mobility: Rolling;Sidelying to Sit;Sit to Sidelying Rolling: Supervision Sidelying to sit: Min guard     Sit to sidelying: Min guard General bed mobility comments: reinforced and practiced technique until pt demo'd good technique  Transfers Overall transfer level: Needs assistance Equipment used: None Transfers: Sit to/from Stand Sit to Stand: Supervision            Ambulation/Gait Ambulation/Gait assistance: Supervision Ambulation Distance (Feet): 250 Feet Assistive device: None Gait Pattern/deviations: Step-through pattern   Gait velocity interpretation: Below normal speed for age/gender General Gait Details: mildly antalgic gait, but steady  Stairs            Wheelchair Mobility    Modified Rankin (Stroke Patients Only)       Balance Overall balance assessment: No apparent balance deficits (not formally assessed)                                           Pertinent Vitals/Pain Pain Assessment: Faces Faces Pain Scale: Hurts even more Pain Location: back Pain Descriptors / Indicators: Aching;Grimacing Pain Intervention(s):  Monitored during session    Home Living Family/patient expects to be discharged to:: Private residence Living Arrangements: Other (Comment);Spouse/significant other (and 28 y/o grandson) Available Help at Discharge: Family Type of Home: House Home Access: Level entry     Home Layout: One level        Prior Function Level of Independence: Independent         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     Hand Dominance        Extremity/Trunk Assessment   Upper Extremity Assessment: Defer to OT evaluation           Lower Extremity Assessment: Overall WFL for tasks assessed         Communication   Communication: No difficulties  Cognition Arousal/Alertness: Awake/alert Behavior During Therapy: WFL for tasks assessed/performed Overall Cognitive Status: Within Functional Limits for tasks assessed                      General Comments General comments (skin integrity, edema, etc.): reinforced all back education incl prec., bed mobility, lifting restrictions, bracing issues and typical progression of activity.    Exercises        Assessment/Plan    PT Assessment Patient needs continued PT services  PT Diagnosis Acute pain   PT Problem List Decreased activity tolerance;Decreased mobility;Decreased knowledge of precautions;Pain  PT Treatment Interventions Gait training;Functional mobility training;Therapeutic activities;Stair training;Patient/family education   PT Goals (Current goals  can be found in the Care Plan section) Acute Rehab PT Goals Patient Stated Goal: independent and eventually back to work PT Goal Formulation: With patient Time For Goal Achievement: 08/21/14 Potential to Achieve Goals: Good    Frequency Min 5X/week   Barriers to discharge        Co-evaluation               End of Session Equipment Utilized During Treatment: Back brace Activity Tolerance: Patient tolerated treatment well Patient left: in bed;with  call bell/phone within reach (sitting EOB) Nurse Communication: Mobility status         Time: 5427-0623 PT Time Calculation (min) (ACUTE ONLY): 23 min   Charges:   PT Evaluation $Initial PT Evaluation Tier I: 1 Procedure PT Treatments $Gait Training: 8-22 mins   PT G Codes:        Micco Bourbeau, Tessie Fass 08/14/2014, 5:40 PM 08/14/2014  Donnella Sham, [place] [phone]  (pager)

## 2014-08-14 NOTE — Brief Op Note (Signed)
08/14/2014  11:26 AM  PATIENT:  Laurie Burch  64 y.o. female  PRE-OPERATIVE DIAGNOSIS:  SPINAL STENOSIS L3-5  POST-OPERATIVE DIAGNOSIS:  SPINAL STENOSIS L3-5  PROCEDURE:  Procedure(s): LUMBAR DECOMPRESSION L3-5, LEFT SIDED DISCECTOMY L3-4 (N/A)  SURGEON:  Surgeon(s) and Role:    * Melina Schools, MD - Primary  PHYSICIAN ASSISTANT:   ASSISTANTS: none   ANESTHESIA:   general  EBL:  Total I/O In: 1050 [I.V.:1050] Out: 420 [Urine:220; Blood:200]  BLOOD ADMINISTERED:none  DRAINS: none   LOCAL MEDICATIONS USED:  MARCAINE     SPECIMEN:  No Specimen  DISPOSITION OF SPECIMEN:  N/A  COUNTS:  YES  TOURNIQUET:  * No tourniquets in log *  DICTATION: .Other Dictation: Dictation Number [id number]  PLAN OF CARE: Admit to inpatient   PATIENT DISPOSITION:  PACU - hemodynamically stable.

## 2014-08-14 NOTE — Anesthesia Postprocedure Evaluation (Signed)
  Anesthesia Post-op Note  Patient: Laurie Burch  Procedure(s) Performed: Procedure(s): LUMBAR DECOMPRESSION L3-5, LEFT SIDED DISCECTOMY L3-4 (N/A)  Patient Location: PACU  Anesthesia Type:General  Level of Consciousness: awake and alert   Airway and Oxygen Therapy: Patient Spontanous Breathing  Post-op Pain: Controlled  Post-op Assessment: Post-op Vital signs reviewed, Patient's Cardiovascular Status Stable and Respiratory Function Stable  Post-op Vital Signs: Reviewed  Filed Vitals:   08/14/14 1245  BP: 133/68  Pulse: 60  Temp: 36.7 C  Resp: 11    Complications: No apparent anesthesia complications

## 2014-08-15 ENCOUNTER — Encounter (HOSPITAL_COMMUNITY): Payer: Self-pay | Admitting: Orthopedic Surgery

## 2014-08-15 LAB — HEMOGLOBIN A1C
HEMOGLOBIN A1C: 6.4 % — AB (ref 4.8–5.6)
MEAN PLASMA GLUCOSE: 137 mg/dL

## 2014-08-15 LAB — GLUCOSE, CAPILLARY
GLUCOSE-CAPILLARY: 161 mg/dL — AB (ref 65–99)
GLUCOSE-CAPILLARY: 142 mg/dL — AB (ref 65–99)

## 2014-08-15 NOTE — Progress Notes (Signed)
Physical Therapy Treatment Patient Details Name: Laurie Burch MRN: [id number] DOB: 18-Aug-1950 Today's Date: 08/15/2014    History of Present Illness Pt is 64 y/o female admitted with pain radiating down L LE, s/p L3-5 decompression and L 3-4 discectomy.    PT Comments    Patient reports having ambulated  Already this am and later This writer observed patient up ad lib.  Reviewed back precautions and  and mobility after discharge. No further needs.  Follow Up Recommendations  No PT follow up     Equipment Recommendations  None recommended by PT    Recommendations for Other Services       Precautions / Restrictions Precautions Precautions: Back Precaution Booklet Issued: Yes (comment) Precaution Comments: provided  handout, reviewed back precautions, instructed in placing pillows under arms and resting feet on something to prevent dangling. Required Braces or Orthoses: Spinal Brace Spinal Brace: Lumbar corset;Applied in sitting position    Mobility  Bed Mobility               General bed mobility comments: pt in recliner, reviewed  log rolling and bed mobility verbally/visually.  Transfers                 General transfer comment: pt declined to ambulate, later this writer observed patient  ambulating in the hall independently.  Ambulation/Gait                 Stairs            Wheelchair Mobility    Modified Rankin (Stroke Patients Only)       Balance                                    Cognition                            Exercises      General Comments        Pertinent Vitals/Pain Faces Pain Scale: Hurts even more Pain Location: back Pain Descriptors / Indicators: Aching;Sore Pain Intervention(s): Monitored during session;Premedicated before session;Repositioned    Home Living                      Prior Function            PT Goals (current goals can now be found in the care plan  section) Progress towards PT goals: Progressing toward goals    Frequency       PT Plan Current plan remains appropriate    Co-evaluation             End of Session Equipment Utilized During Treatment: Back brace Activity Tolerance: Patient tolerated treatment well Patient left: in chair;with call bell/phone within reach     Time: 0750-0824 PT Time Calculation (min) (ACUTE ONLY): 34 min  Charges:  $Self Care/Home Management: 21-Oct-2022                    G Codes:      Claretha Cooper 08/15/2014, 9:32 AM Tresa Endo PT [phone]

## 2014-08-15 NOTE — Progress Notes (Signed)
Occupational Therapy Evaluation Patient Details Name: Laurie Burch MRN: [id number] DOB: [birth year] Today's Date: 08/15/2014    History of Present Illness Pt is 64 y/o female admitted with pain radiating down L LE, s/p L3-5 decompression and L 3-4 discectomy.   Clinical Impression   Making steady progress. Completed all education regarding compensatory techniques for ADL and functional mobility. Pt able to return demonstrte. Pt ready to D/C home with intermittent S when medically stable.     Follow Up Recommendations  No OT follow up;Supervision - Intermittent    Equipment Recommendations  3 in 1 bedside comode    Recommendations for Other Services       Precautions / Restrictions Precautions Precautions: Back Precaution Booklet Issued: Yes (comment) Precaution Comments: provided  handout, reviewed back precautions, instructed in placing pillows under arms and resting feet on something to prevent dangling. Required Braces or Orthoses: Spinal Brace Spinal Brace: Lumbar corset;Applied in sitting position Restrictions Weight Bearing Restrictions: No      Mobility Bed Mobility Overal bed mobility: Needs Assistance             General bed mobility comments: educated on log rolling techique. Pt able to return demosntrate  Transfers Overall transfer level: Modified independent                   Balance Overall balance assessment: No apparent balance deficits (not formally assessed)                                          ADL Overall ADL's : Needs assistance/impaired       Grooming Details (indicate cue type and reason): Educated pt on compensatory technie               Lower Body Dressing Details (indicate cue type and reason): Educated pt on technie in sitting with crossing legs. Pt declined to get dressed this session.       Toileting - Clothing Manipulation Details (indicate cue type and reason): educatedon use of toilet aid  for hygiene and bating     Functional mobility during ADLs: Modified independent General ADL Comments: Completed education regading compensatory techniques  and saety for ADL using back precautions. REviewed availability and use of AE if needed. discussed home safety and reducing risk of falls. discussed activity modification. Educated on safe tub trasnfer techniques. Pt verbalized understanding.                      Pertinent Vitals/Pain Pain Assessment: Faces Faces Pain Scale: Hurts little more Pain Location: back Pain Descriptors / Indicators: Aching;Sore Pain Intervention(s): Limited activity within patient's tolerance;Monitored during session;Repositioned;Ice applied     Hand Dominance Right   Extremity/Trunk Assessment Upper Extremity Assessment Upper Extremity Assessment: Overall WFL for tasks assessed   Lower Extremity Assessment Lower Extremity Assessment: Overall WFL for tasks assessed   Cervical / Trunk Assessment Cervical / Trunk Assessment: Other exceptions (back surgery)   Communication Communication Communication: No difficulties   Cognition Arousal/Alertness: Awake/alert Behavior During Therapy: WFL for tasks assessed/performed Overall Cognitive Status: Within Functional Limits for tasks assessed                     General Comments   Pt appreciative of information.     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    OT Diagnosis: Generalized weakness;Acute pain   OT Problem List: Decreased activity tolerance;Decreased knowledge of use of DME or AE;Pain   OT Treatment/Interventions:      OT Goals(Current goals can be found in the care plan section) Acute Rehab OT Goals Patient Stated Goal: to get better OT Goal Formulation: All assessment and education complete, DC therapy  OT Frequency:     Barriers to D/C:            Co-evaluation              End of Session Nurse Communication: Mobility status  Activity Tolerance: Patient tolerated treatment well Patient left: in bed;with call bell/phone within reach   Time: 0930-0950 OT Time Calculation (min): 20 min Charges:  OT General Charges $OT Visit: 1 Procedure OT Evaluation $Initial OT Evaluation Tier I: 1 Procedure G-Codes:    Lurleen Soltero,H[place] August 22, 2014, 10:59 AM   Maurie Boettcher, OTR/L  [phone] 08/22/2014

## 2014-08-15 NOTE — Op Note (Signed)
Laurie Burch, Laurie Burch              ACCOUNT NO.:  [id number]  MEDICAL RECORD NO.:  [id number]  LOCATION:                               FACILITY:  [facility]  PHYSICIAN:  Tesia Lybrand D. Rolena Infante, M.D. DATE OF BIRTH:  1950-11-30  DATE OF PROCEDURE:  08/14/2014 DATE OF DISCHARGE:  08/14/2014                              OPERATIVE REPORT   PREOPERATIVE DIAGNOSES:  Lumbar spinal stenosis, L3-4, L4-5 and L3-4 disk herniation posterolateral to left.  POSTOPERATIVE DIAGNOSES:  Lumbar spinal stenosis, L3-4, L4-5 and L3-4 disk herniation posterolateral to left.  OPERATIVE PROCEDURE:  L3-4, L4-5 decompression laminectomy and foraminotomy and L3-4 diskectomy.  COMPLICATIONS:  None.  CONDITION:  Stable.  HISTORY:  This is a very pleasant 64 year old woman who has been having significant neurogenic claudication, back pain, secondary to spinal stenosis.  Attempts at conservative management had failed to alleviate her symptoms, and so, we elected to proceed with surgery.  All appropriate risks, benefits, and alternatives were discussed with the patient and consent was obtained.  OPERATIVE NOTE:  The patient was brought to the operating room, placed supine on the operating table.  After successful induction of general anesthesia and endotracheal intubation, TEDs, SCDs, and Foley were inserted.  The patient was turned prone onto the Wilson frame.  All bony prominences were well padded and the back was prepped and draped in standard fashion.  Time-out was then taken to confirm the patient, procedure, and all other pertinent important data.  Once this was completed, 2 needles were placed in the back and an x-ray was taken for skin incision marking.  I infiltrated the proposed midline incision and then made an incision midline standing at the superior aspect of L3 and proceed to the inferior aspect of L5.  Sharp dissection was carried out down to the deep fascia.  Once the deep fascia was cleared, I  incised this and I stripped the paraspinal muscles to expose the L3 and L4 and a portion of the L5 spinous process and lamina.  Once the central area was decompressed, I then went out laterally.  Care was taken not to violate the facet capsules.  Once I had the posterior aspect of the spine completely exposed and placed a Penfield 4 underneath the L2-L3 lamina and took an x-ray.  Once I confirmed the L3-4 level, I then removed the L4 spinous process and the L3 spinous process with double-action Leksell rongeur.  I then developed a plane underneath the lamina of L4 and then used a 3-mm Kerrison punch to perform a central decompression and a near complete laminectomy of L4.  Once I had this area decompressed, I then developed a plane in the central raphe of the ligamentum flavum and then resected the ligamentum flavum using a 2 and 3 mm Kerrison punch.  Once I had the central decompression complete, I then went inferiorly and trimmed down the soup the leading edge of the L5 lamina to adequately decompress the central region.  Since I was standing on the patient's left, I then worked into the right lateral gutter.  I then identified the L5 pedicle and made sure I had removed all the ligamentum flavum and bone spur medial  to this.  I then identified the L5 nerve root and performed a foraminotomy.  I then went superiorly into the lateral recess at L4-5 and removed the thickened ligamentum flavum and bone spur.  Care was taken not to violate the pars.  I then identified the L4 exiting nerve root and then the L4 nerve.  It should be noted that I also performed a similar central decompression at L3 with the L3 lamina and once I had completed the laminectomy of L4 and central L3-L5 decompression.  I continued to work in the lateral recess removing the thickened ligamentum flavum and clearing the lateral gutter and the foramen.  With the right side completely decompressed, I then switched to the  left.  Using the same technique, I decompressed the lateral recess with a 2 and 3 mm Kerrison punch.  I then isolated the L3-4 disk and identified the herniation.  I protected the thecal sac, incised the annulus and then removed the fragmented disk material that had herniated.  Once, I had the diskectomy complete, I then was able to sweep circumferentially at the level of the annulus with a Surveyor, quantity.  I then continued into the lateral recess.  There was significant stenosis at L4-5 consistent with the preoperative MRI identified.  Once I had the decompression complete, I could now take a [facility] and pass it superiorly through the L3 pedicle, out the L3, L4, and L5 foramen and along the lateral recess bilaterally.  At this point, I obtained hemostasis using bipolar electrocautery and Floseal.  I then took a third and final x-ray with metallic marker in the U2-7 disk space confirming the diskectomy and marking of the superior and inferior edges of my decompression.  I copiously irrigated the wound with normal saline, and maintained good hemostasis.  I then placed a large thrombin-soaked Gelfoam patty over the exposed thecal sac and then closed the deep fascia with interrupted #1 Vicryl sutures, and then closed the deep layer and superficial with 2-0 interrupted Vicryl sutures and skin with 3-0 Monocryl.  Steri-Strips and a dry dressing were applied.  The patient was extubated, transferred to PACU without incident.  At the end of the case, all needle and sponge counts were correct.  There was no adverse intraoperative events.     Rori Goar D. Rolena Infante, M.D.   ______________________________ Blake Divine. Rolena Infante, M.D.    DDB/MEDQ  D:  08/14/2014  T:  08/14/2014  Job:  [id number]

## 2014-08-15 NOTE — Progress Notes (Signed)
    Subjective: Procedure(s) (LRB): LUMBAR DECOMPRESSION L3-5, LEFT SIDED DISCECTOMY L3-4 (N/A) 1 Day Post-Op  Patient reports pain as 3 on 0-10 scale.  Reports decreased leg pain reports incisional back pain   Positive void Negative bowel movement Positive flatus Negative chest pain or shortness of breath  Objective: Vital signs in last 24 hours: Temp:  [98 F (36.7 C)-99.5 F (37.5 C)] 99.5 F (37.5 C) (07/07 0836) Pulse Rate:  [60-78] 78 (07/07 0836) Resp:  [11-21] 18 (07/07 0836) BP: (99-148)/(50-76) 106/55 mmHg (07/07 0836) SpO2:  [93 %-100 %] 93 % (07/07 0836)  Intake/Output from previous day: 07/06 0701 - 07/07 0700 In: 2510 [P.O.:960; I.V.:1550] Out: 420 [Urine:220; Blood:200]  Labs: No results for input(s): WBC, RBC, HCT, PLT in the last 72 hours. No results for input(s): NA, K, CL, CO2, BUN, CREATININE, GLUCOSE, CALCIUM in the last 72 hours. No results for input(s): LABPT, INR in the last 72 hours.  Physical Exam: Neurologically intact ABD soft Intact pulses distally Incision: dressing C/D/I Compartment soft  Assessment/Plan: Patient stable  xrays n/a Continue mobilization with physical therapy Continue care  Advance diet Up with therapy Plan for discharge tomorrow  Melina Schools, MD [place] [phone]

## 2014-08-15 NOTE — Progress Notes (Signed)
Pt given D/C instructions with Rx's, verbal understanding was provided. Pt's incision is clean and dry with no sign of infection. Pt's IV was removed prior to D/C. Pt D/C'd home via wheelchair @ 1740 per MD order. Pt is stable @ D/C and has no other needs at this time. Holli Humbles, RN

## 2014-08-15 NOTE — Progress Notes (Signed)
Utilization review completed.  

## 2014-08-23 NOTE — Discharge Summary (Signed)
Patient ID: Laurie Burch MRN: [id number] DOB/AGE: Jun 14, 1950 64 y.o.  Admit date: 08/14/2014 Discharge date: 08/23/2014  Admission Diagnoses:  Active Problems:   Lumbar stenosis with neurogenic claudication   Discharge Diagnoses:  Active Problems:   Lumbar stenosis with neurogenic claudication  status post Procedure(s): LUMBAR DECOMPRESSION L3-5, LEFT SIDED DISCECTOMY L3-4  Past Medical History  Diagnosis Date  . Mitral valve prolapse   . Hypertension   . Hyperlipemia   . Bronchitis     hx of  . Headache(784.0)   . Gastritis     Takes Nexium  . Arthritis   . 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 Lumbar stenosis   . PONV (postoperative nausea and vomiting)     Years ago- whe I had shut out in  . Heart murmur   . Head injury, acute, without loss of consciousness     years ago  . Shortness of breath dyspnea     with exertion  . Diabetes mellitus without complication     Type II  . GERD (gastroesophageal reflux disease)     Not taking medications , "its better".    Surgeries: Procedure(s): LUMBAR DECOMPRESSION L3-5, LEFT SIDED DISCECTOMY L3-4 on 08/14/2014   Consultants:    Discharged Condition: Improved  Hospital Course: Laurie Burch is an 64 y.o. female who was admitted 08/14/2014 for operative treatment of <principal problem not specified>. Patient failed conservative treatments (please see the history and physical for the specifics) and had severe unremitting pain that affects sleep, daily activities and work/hobbies. After pre-op clearance, the patient was taken to the operating room on 08/14/2014 and underwent  Procedure(s): LUMBAR DECOMPRESSION L3-5, LEFT SIDED DISCECTOMY L3-4.    Patient was given perioperative antibiotics:  Anti-infectives    Start     Dose/Rate Route Frequency Ordered Stop   08/14/14 1630  ceFAZolin (ANCEF) IVPB 1 g/50 mL premix     1 g 100 mL/hr over 30 Minutes Intravenous Every 8 hours 08/14/14 1313 08/14/14 2345   08/14/14 0710  ceFAZolin (ANCEF) IVPB 2 g/50 mL premix     2 g 100 mL/hr over 30 Minutes Intravenous 30 min pre-op 08/14/14 0710 08/14/14 0840       Patient was given sequential compression devices and early ambulation to prevent DVT.   Patient benefited maximally from hospital stay and there were no complications. At the time of discharge, the patient was urinating/moving their bowels without difficulty, tolerating a regular diet, pain is controlled with oral pain medications and they have been cleared by PT/OT.   Recent vital signs: No data found.    Recent laboratory studies: No results for input(s): WBC, HGB, HCT, PLT, NA, K, CL, CO2, BUN, CREATININE, GLUCOSE, INR, CALCIUM in the last 72 hours.  Invalid input(s): PT, 2   Discharge Medications:     Medication List    STOP taking these medications        aspirin 81 MG tablet     ondansetron 4 MG disintegrating tablet  Commonly known as:  ZOFRAN ODT      TAKE these medications        albuterol 108 (90 BASE) MCG/ACT inhaler  Commonly known as:  PROVENTIL HFA;VENTOLIN HFA  Inhale 2 puffs into the lungs 4 (four) times daily as needed for wheezing or shortness of breath.     bisoprolol-hydrochlorothiazide 2.5-6.25 MG per tablet  Commonly known as:  ZIAC  Take 2 tablets by mouth daily.  calcium-vitamin D 500-200 MG-UNIT per tablet  Commonly known as:  OSCAL WITH D  Take 1 tablet by mouth daily with breakfast.     CALTRATE 600+D PO  Take 1 tablet by mouth 2 (two) times daily.     Cinnamon 500 MG Tabs  Take 500 mg by mouth daily.     docusate sodium 100 MG capsule  Commonly known as:  COLACE  Take 1 capsule (100 mg total) by mouth 2 (two) times daily.     glyBURIDE micronized 3 MG tablet  Commonly known as:  GLYNASE  Take 3 mg by mouth 4 (four) times daily. Take 2 tablets in the morning, then two tablets in the afternoon     IRON PO  Take 1 tablet by mouth daily. 65mg      meclizine 50 MG tablet  Commonly  known as:  ANTIVERT  Take 1 tablet (50 mg total) by mouth 3 (three) times daily as needed.     metFORMIN 500 MG 24 hr tablet  Commonly known as:  GLUCOPHAGE-XR  Take 1,000 mg by mouth 2 (two) times daily.     methocarbamol 500 MG tablet  Commonly known as:  ROBAXIN  Take 1 tablet (500 mg total) by mouth 3 (three) times daily as needed for muscle spasms.     multivitamin with minerals tablet  Take 1 tablet by mouth daily.     omega-3 acid ethyl esters 1 G capsule  Commonly known as:  LOVAZA  Take 1 g by mouth daily.     ondansetron 4 MG tablet  Commonly known as:  ZOFRAN  Take 1 tablet (4 mg total) by mouth every 8 (eight) hours as needed for nausea or vomiting.     oxyCODONE-acetaminophen 10-325 MG per tablet  Commonly known as:  PERCOCET  Take 1 tablet by mouth every 4 (four) hours as needed for pain.     polyethylene glycol packet  Commonly known as:  MIRALAX / GLYCOLAX  Take 17 g by mouth daily.     pravastatin 40 MG tablet  Commonly known as:  PRAVACHOL  Take 80 mg by mouth at bedtime.     pyridOXINE 100 MG tablet  Commonly known as:  VITAMIN B-6  Take 100 mg by mouth daily.     sitaGLIPtin 100 MG tablet  Commonly known as:  JANUVIA  Take 100 mg by mouth daily.     vitamin E 400 UNIT capsule  Take 400 Units by mouth 2 (two) times daily.        Diagnostic Studies: Dg Lumbar Spine 2-3 Views  08/14/2014   CLINICAL DATA:  Lumbar decompression L3 - L5  EXAM: LUMBAR SPINE - 2-3 VIEW  COMPARISON:  Lumbar spine series December 26, 2007  FINDINGS: Two cross-table lateral images are submitted. On image labeled #1, metallic probe tips are posterior to the L5-S1 level in the inferior aspect of L4. On image labeled #2, there is a metallic probe tip posterior to the superior most aspect of the L4 vertebral body. There is moderate anterior wedging of the T12 vertebral body. No other fracture. No spondylolisthesis. There is moderate disc space narrowing at L3-4.  IMPRESSION: On  the second submitted image, probe tip is posterior to the superior aspect of the L4 vertebral body. Moderate anterior wedging of the T12 vertebral body is noted. There is moderate disc space narrowing at L3-4. No spondylolisthesis.  These results were called by telephone at the time of interpretation on 08/14/2014 at 9:43 am  to the OR circulating nurse, who verbally acknowledged these results.   Electronically Signed   By: Lowella Grip III M.D.   On: 08/14/2014 09:43   Dg Lumbar Spine 1 View  08/14/2014   CLINICAL DATA:  Lumbar surgery.  EXAM: LUMBAR SPINE - 1 VIEW  COMPARISON:  08/14/2014  FINDINGS: Lumbar vertebra are numbered with the lowest segmented lumbar vertebra on lateral view as L5. Metallic markers are noted posteriorly at the L3 level, L3-L4 disc space level, and L5 level.  IMPRESSION: Metallic markers noted as above .   Electronically Signed   By: [place]   On: 08/14/2014 11:13          Follow-up Information    Follow up with Melina Schools D, MD. Schedule an appointment as soon as possible for a visit in 2 weeks.   Specialty:  Orthopedic Surgery   Why:  For wound re-check, For suture removal   Contact information:   [address] [place] [address] [phone]       Discharge Plan:  discharge to home  Disposition: hospital course uneventful  Tolerating oral medications, pain improved.  F/u in 2 weeks    Signed: Melina Schools D for Dr. Melina Schools [facility] [phone] 08/23/2014, 4:39 PM

## 2014-09-18 ENCOUNTER — Encounter: Payer: Self-pay | Admitting: Internal Medicine

## 2014-10-15 ENCOUNTER — Encounter: Payer: Self-pay | Admitting: Internal Medicine

## 2015-01-20 ENCOUNTER — Ambulatory Visit (AMBULATORY_SURGERY_CENTER): Payer: BLUE CROSS/BLUE SHIELD | Admitting: *Deleted

## 2015-01-20 VITALS — Ht 63.0 in | Wt 176.0 lb

## 2015-01-20 DIAGNOSIS — Z1211 Encounter for screening for malignant neoplasm of colon: Secondary | ICD-10-CM | POA: Diagnosis not present

## 2015-01-20 NOTE — Progress Notes (Signed)
Patient denies any allergies to egg or soy products. Patient hx hx long time ago PONV but no problems since with anesthesia/sedation.  Patient denies oxygen use at home and denies diet medications. Emmi instructions for colonoscopy but patient refused.

## 2015-01-30 ENCOUNTER — Encounter: Payer: BLUE CROSS/BLUE SHIELD | Admitting: Internal Medicine

## 2015-01-30 ENCOUNTER — Telehealth: Payer: Self-pay | Admitting: Internal Medicine

## 2015-01-30 NOTE — Telephone Encounter (Signed)
No charge. 

## 2015-12-30 ENCOUNTER — Ambulatory Visit
Admission: RE | Admit: 2015-12-30 | Discharge: 2015-12-30 | Disposition: A | Discharge status: Home / Self Care | Payer: BLUE CROSS/BLUE SHIELD | Source: Ambulatory Visit | Attending: Physician Assistant | Admitting: Physician Assistant

## 2015-12-30 ENCOUNTER — Other Ambulatory Visit: Payer: Self-pay | Admitting: Physician Assistant

## 2015-12-30 DIAGNOSIS — R059 Cough, unspecified: Secondary | ICD-10-CM

## 2015-12-30 DIAGNOSIS — R05 Cough: Secondary | ICD-10-CM

## 2016-02-12 ENCOUNTER — Encounter: Payer: Self-pay | Admitting: Family Medicine

## 2016-04-14 ENCOUNTER — Encounter: Payer: Self-pay | Admitting: Internal Medicine

## 2016-05-05 IMAGING — MR MR HEAD W/O CM
9 of 10 series · 35 of 48 positions shown · non-contrast
Comparison: No comparison brain MR is available. Prior cervical
spine CT 04/04/2006.

CLINICAL DATA: 64-year-old diabetic hypertensive female with
history of hyperlipidemia and pseudotumor presenting with dizziness
and nausea. Headache yesterday Initial encounter.

EXAM:
MRI HEAD WITHOUT CONTRAST
TECHNIQUE: Multiplanar, multiecho pulse sequences of the brain and surrounding
structures were obtained without intravenous contrast.

[Series 3: T1 · sagittal · 5.0mm · 0.47mm/px · 2 of 23 slices shown]
[im 1/23]
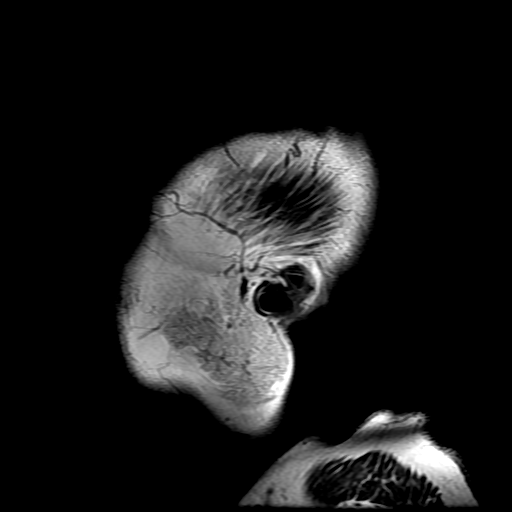
[im 23/23]
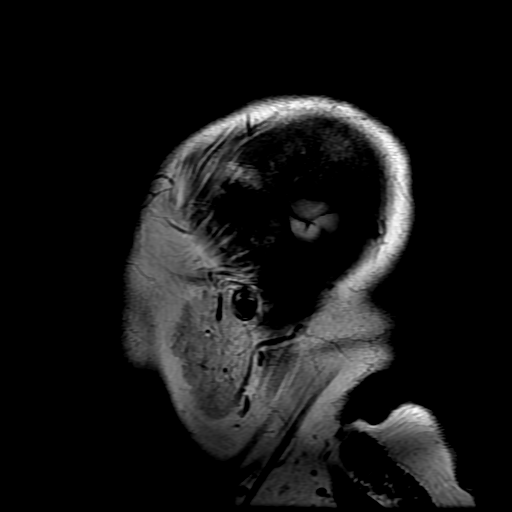

[Series 4: DWI · axial · 3.0mm · 1.09mm/px · z∈[-38,+109]mm · 8 of 100 slices shown (1 of 4)]
[im 1/100]
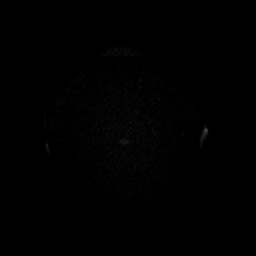
[im 12/100]
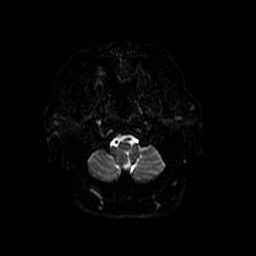
[im 34/100]
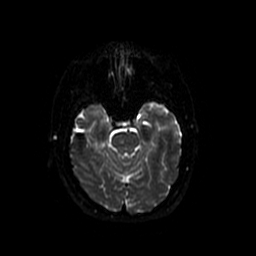
[im 45/100]
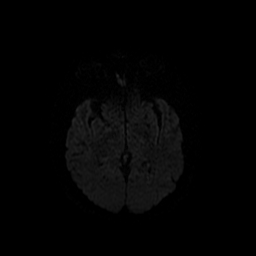
[im 56/100]
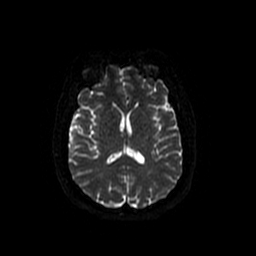
[im 67/100]
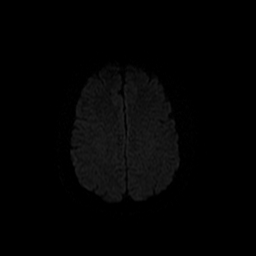
[im 89/100]
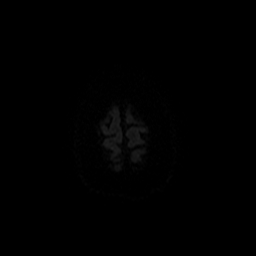
[im 100/100]
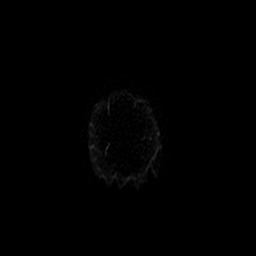

[Series 5: T2 · axial · 5.0mm · 0.43mm/px · z∈[-47,+109]mm · 3 of 27 slices shown (1 of 2)]
[im 1/27]
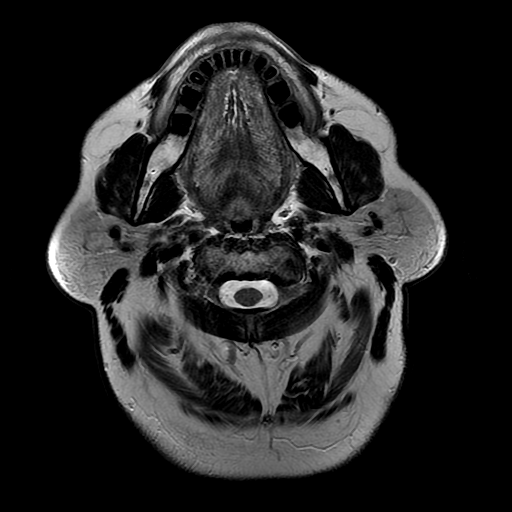
[im 14/27]
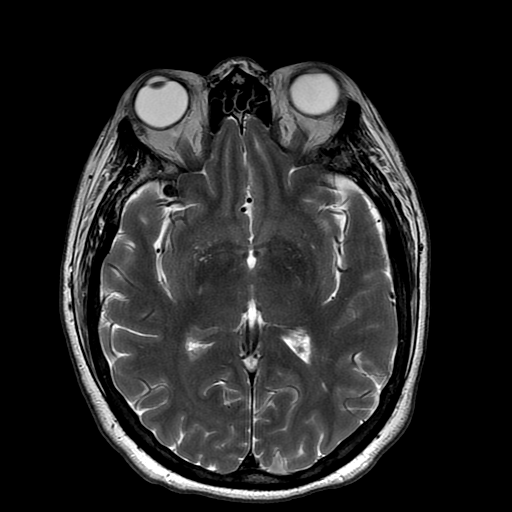
[im 27/27]
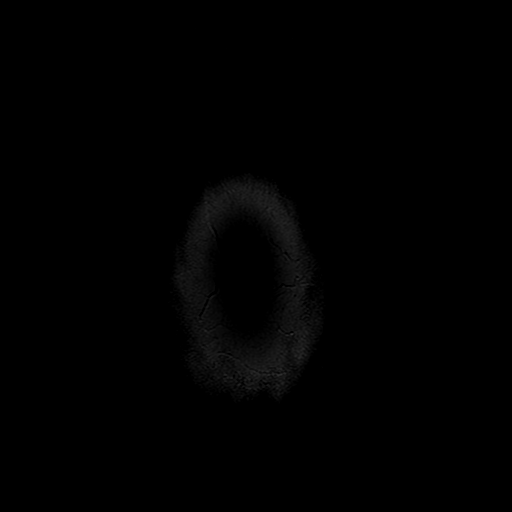

[Series 6: DWI · coronal · 5.0mm · 1.09mm/px · 7 of 68 slices shown (2 of 4)]
[im 1/68]
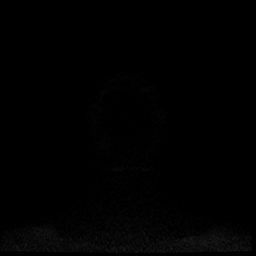
[im 12/68]
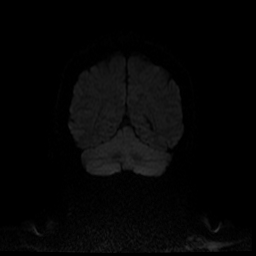
[im 23/68]
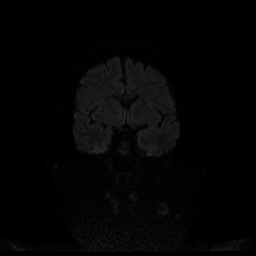
[im 34/68]
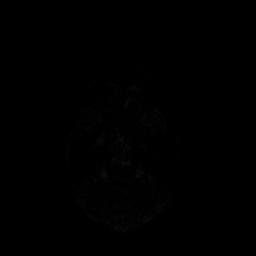
[im 45/68]
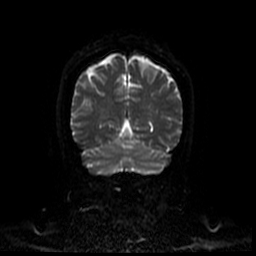
[im 56/68]
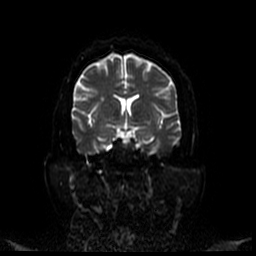
[im 68/68]
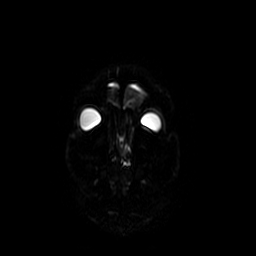

[Series 7: FLAIR · axial · 5.0mm · 0.43mm/px · z∈[-47,+109]mm · 3 of 27 slices shown]
[im 1/27]
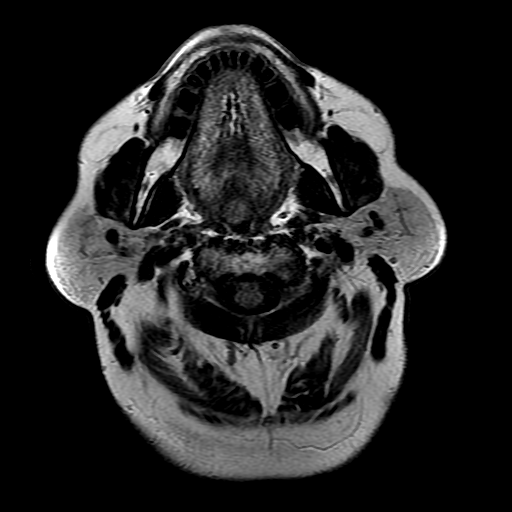
[im 14/27]
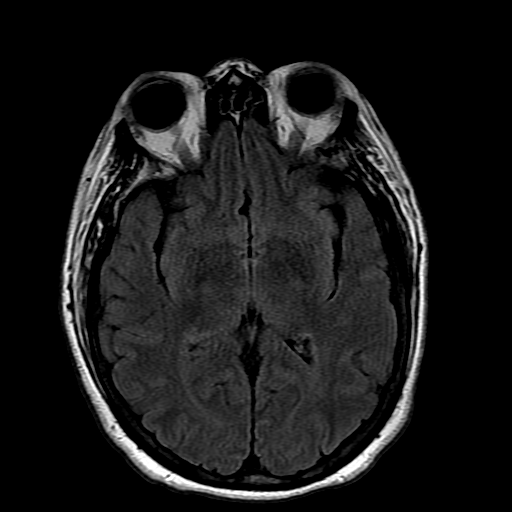
[im 27/27]
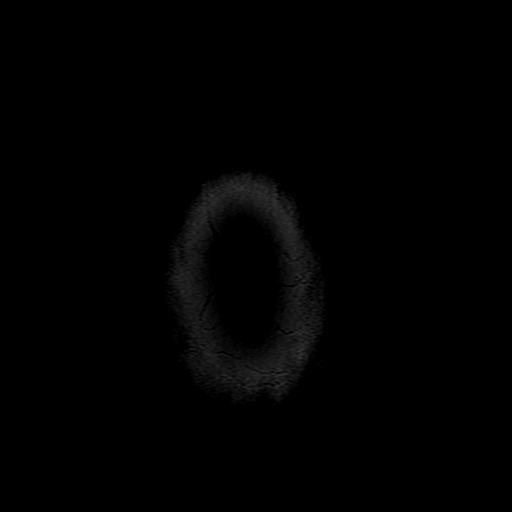

[Series 8: ax mpgr · axial · 5.0mm · 0.43mm/px · 1 of 25 slices shown]
[im 1/25]
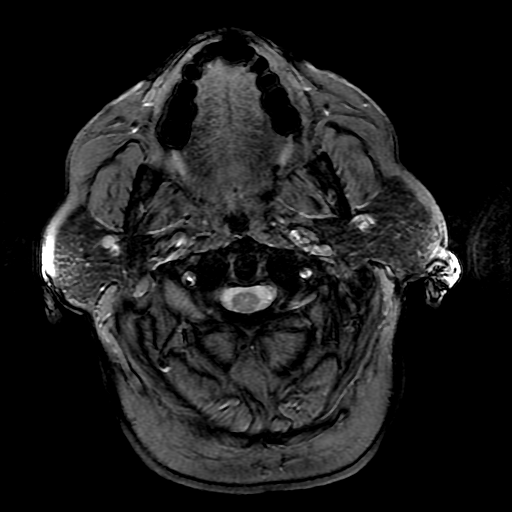

[Series 10: T2 · coronal · 5.0mm · 0.39mm/px · 3 of 27 slices shown (2 of 2)]
[im 1/27]
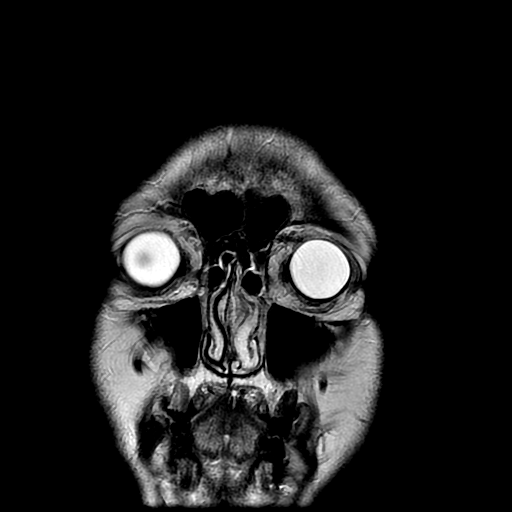
[im 14/27]
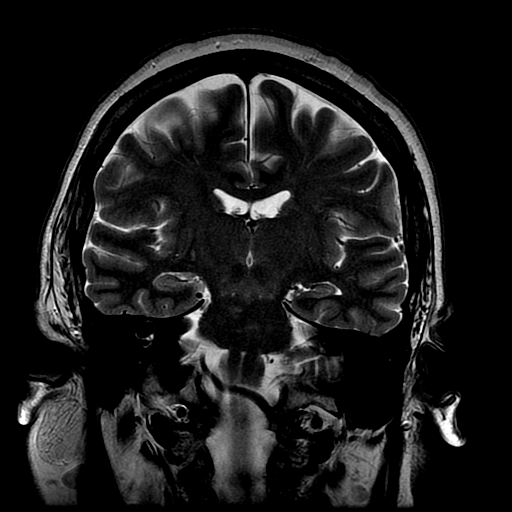
[im 27/27]
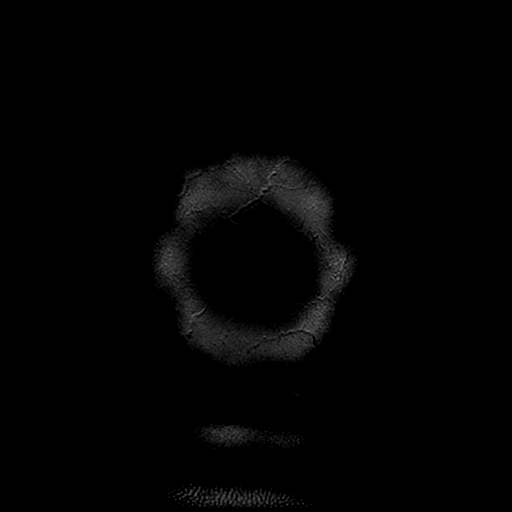

[Series 400: DWI · axial · 3.0mm · 1.09mm/px · z∈[-38,+109]mm · 5 of 50 slices shown (3 of 4)]
[im 1/50]
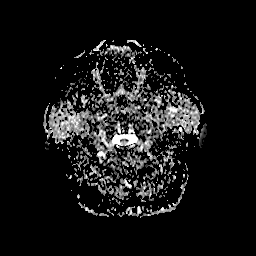
[im 13/50]
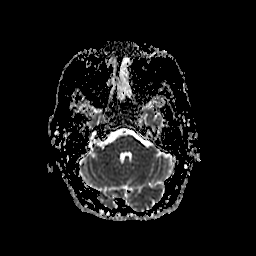
[im 25/50]
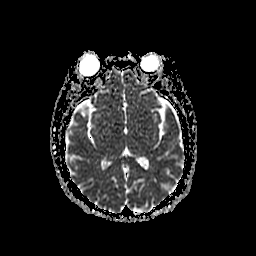
[im 37/50]
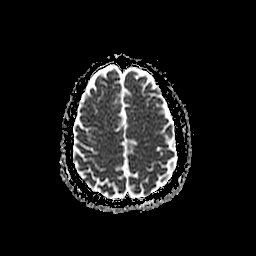
[im 50/50]
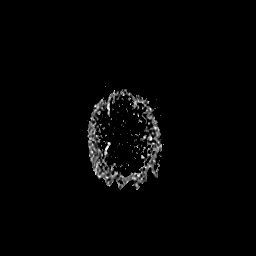

[Series 600: DWI · coronal · 5.0mm · 1.09mm/px · 3 of 34 slices shown (4 of 4)]
[im 1/34]
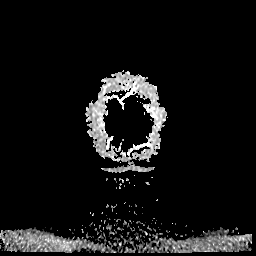
[im 17/34]
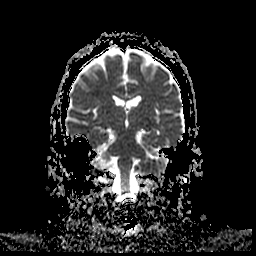
[im 34/34]
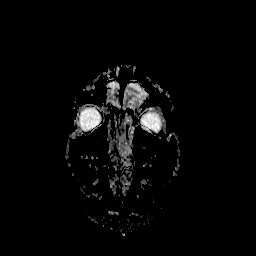

[35 of 48 positions shown; findings below may reference images not displayed]

FINDINGS: No acute infarct.

Mild punctate and patchy white matter type changes most likely
related to result of small vessel disease.

No intracranial hemorrhage.

No hydrocephalus.

No intracranial mass lesion noted on this unenhanced exam.

Major intracranial vascular structures are patent.

Prior surgery C4-5 level. Cervical medullary junction and pineal
region unremarkable.

Partially empty sella without significant expansion without
secondary findings of pseudotumor. Mild exophthalmos noted.

Mild mucosal thickening ethmoid sinus air cells. Minimal partial
opacification mastoid air cells.
IMPRESSION: No acute infarct.

Mild small vessel disease type changes.

No intracranial hemorrhage.

No intracranial mass lesion noted on this unenhanced exam..

Partially empty sella without significant expansion without
secondary findings of pseudotumor. Mild exophthalmos noted.

Mild mucosal thickening ethmoid sinus air cells. Minimal partial
opacification mastoid air cells.

## 2016-05-25 ENCOUNTER — Encounter: Payer: Self-pay | Admitting: Internal Medicine

## 2016-05-25 ENCOUNTER — Ambulatory Visit (INDEPENDENT_AMBULATORY_CARE_PROVIDER_SITE_OTHER): Payer: BLUE CROSS/BLUE SHIELD | Admitting: Internal Medicine

## 2016-05-25 VITALS — BP 120/80 | HR 68 | Ht 63.0 in | Wt 178.4 lb

## 2016-05-25 DIAGNOSIS — Z1211 Encounter for screening for malignant neoplasm of colon: Secondary | ICD-10-CM

## 2016-05-25 DIAGNOSIS — K219 Gastro-esophageal reflux disease without esophagitis: Secondary | ICD-10-CM | POA: Diagnosis not present

## 2016-05-25 DIAGNOSIS — R1319 Other dysphagia: Secondary | ICD-10-CM

## 2016-05-25 DIAGNOSIS — R1310 Dysphagia, unspecified: Secondary | ICD-10-CM | POA: Diagnosis not present

## 2016-05-25 MED ORDER — ESOMEPRAZOLE MAGNESIUM 40 MG PO CPDR: 40.0000 mg | DELAYED_RELEASE_CAPSULE | Freq: Every day | ORAL | 3 refills | Status: DC

## 2016-05-25 NOTE — Patient Instructions (Addendum)
It has been recommended to you by your physician that you have a(n)Colonoscopy/Endoscopy completed. Per your request, we did not schedule the procedure(s) today. Please contact our office at [phone] should you decide to have the procedure completed.  We have sent the following medications to your pharmacy for you to pick up at your convenience: Generic Nexium   I appreciate the opportunity to care for you. Silvano Rusk, MD, [facility]

## 2016-05-25 NOTE — Progress Notes (Signed)
Laurie Burch 66 y.o. 1950/07/14 [id number] Referred by: Shirline Frees, MD  Assessment & Plan:   Encounter Diagnoses  Name Primary?  . Esophageal dysphagia Yes  . Gastroesophageal reflux disease, esophagitis presence not specified   . Colon cancer screening     Evaluate and treat with EGD/esophageal dilation Schedule screening colonoscopy The risks and benefits as well as alternatives of endoscopic procedure(s) have been discussed and reviewed. All questions answered. The patient agrees to proceed.  Esomeprazole 40 mgh qd Rx I appreciate the opportunity to care for this patient  PJ:[id number], Gwyndolyn Saxon, MD   Subjective:   Chief Complaint: GERD, colon cancer screening  HPI  Very nice lady - known from colonoscopy 11 yrs ago - here with heartburn and intermittent sold food dysphagia. c/o also having sore throat at times, especially in AM. Has had 2 C-spine surgeries in past 2 years about. Trying to avoid triggers like spicy food. Says she was asked to stop nexioum in past - ? If not insurance formualry issue. Has some chest tightness with dysphagia. She drinks and food passes. GI ROS o/w negative.   Allergies  Allergen Reactions  . Crestor [Rosuvastatin Calcium]     Muscle pain, fatigue  . Glucotrol [Glipizide]   . Irbesartan     Body aches  . Lipitor [Atorvastatin]     Muscle pain, fatigue  . Lisinopril Cough  . Prandin [Repaglinide]   . Prednisone     REACTION: Only allergic to large doses   . Simvastatin     Muscle aches, fatigue  . Prilosec [Omeprazole] Rash    REACTION: gas   Current Meds  Medication Sig  . albuterol (PROVENTIL HFA;VENTOLIN HFA) 108 (90 BASE) MCG/ACT inhaler Inhale 2 puffs into the lungs 4 (four) times daily as needed for wheezing or shortness of breath.  Marland Kitchen aspirin EC 81 MG tablet Take 81 mg by mouth daily.  . bisacodyl (DULCOLAX) 5 MG EC tablet Take 5 mg by mouth daily as needed for moderate constipation. Dulcolax 5 mg tab take as  directed for colonoscopy prep.  . bisoprolol-hydrochlorothiazide (ZIAC) 2.5-6.25 MG per tablet Take 2 tablets by mouth daily.  . Calcium Carbonate-Vitamin D (CALTRATE 600+D PO) Take 1 tablet by mouth 2 (two) times daily.  . Cinnamon 500 MG TABS Take 500 mg by mouth daily.  . diclofenac (VOLTAREN) 75 MG EC tablet Take 75 mg by mouth 2 (two) times daily as needed.  . glyBURIDE micronized (GLYNASE) 3 MG tablet Take 3 mg by mouth 4 (four) times daily. Take 2 tablets in the morning, then two tablets in the afternoon  . IRON PO Take 1 tablet by mouth daily. 65mg   . Melatonin 5 MG CHEW Chew by mouth.  . metFORMIN (GLUCOPHAGE-XR) 500 MG 24 hr tablet Take 1,000 mg by mouth 2 (two) times daily.  . Multiple Vitamins-Minerals (MULTIVITAMIN WITH MINERALS) tablet Take 1 tablet by mouth daily.  Marland Kitchen omega-3 acid ethyl esters (LOVAZA) 1 G capsule Take 1 g by mouth daily.  . pravastatin (PRAVACHOL) 40 MG tablet Take 80 mg by mouth at bedtime.  . pyridOXINE (VITAMIN B-6) 100 MG tablet Take 100 mg by mouth daily.  . sitaGLIPtin (JANUVIA) 100 MG tablet Take 100 mg by mouth daily.   Past Medical History:  Diagnosis Date  . Allergic rhinitis   . Anemia   . Arthritis    ankle, neck  . Asthma   . Bronchitis    hx of  . Cataract    bilateral -  small cataracts, no tx yet  . Cerebral tumor ([place])    Pseudo tumor has shunt  . Diabetes mellitus without complication ([place])    Type II  . Gastritis    Takes OTC PRN  . GERD (gastroesophageal reflux disease)    Not taking medications , "its better". OTC med PRN  . Head injury, acute, without loss of consciousness    years ago  . Headache(784.0)   . Heart murmur   . Hyperlipemia   . Hypertension   . Lumbar stenosis    Hx  . Meningitis 1993  . Mitral valve prolapse   . OA (osteoarthritis)   . Osteopenia   . Overactive bladder   . PONV (postoperative nausea and vomiting)    Years ago- whe I had shut out in  . Shortness of breath dyspnea    Hx - bronchitis    Past Surgical History:  Procedure Laterality Date  . ABDOMINAL HYSTERECTOMY  1989  . ANKLE SURGERY Right 1978  . ANTERIOR CERVICAL DECOMP/DISCECTOMY FUSION N/A 06/07/2013   Procedure: EXPLORATION OF FUSION AND REMOVAL OF CERVICAL HARDWARE AND ACDF C4-5;  Surgeon: Melina Schools, MD;  Location: [facility];  Service: Orthopedics;  Laterality: N/A;  . ANTERIOR CRUCIATE LIGAMENT REPAIR Right 2015  . APPENDECTOMY  1976  . Arthoscopy Right 1996   right knee  . CERVICAL SPINE SURGERY  2008  . CESAREAN SECTION  1981  . COLONOSCOPY  2006   Gessner - internal hemorrhoid  . INSERT INTRAPERITONEAL CATHETER     reinsert-cath  . LUMBAR LAMINECTOMY/DECOMPRESSION MICRODISCECTOMY N/A 08/14/2014   Procedure: LUMBAR DECOMPRESSION L3-5, LEFT SIDED DISCECTOMY L3-4;  Surgeon: Melina Schools, MD;  Location: [facility];  Service: Orthopedics;  Laterality: N/A;  . LUMBAR PERITONEAL SHUNT  1984   Pseudo- Tumor- Cerebral  . LYMPH NODE DISSECTION Left   . [place]  . SYNOVECTOMY  1992   Flexox Tendon  . TONSILLECTOMY  1968   and Adnoids  . VEIN LIGATION AND STRIPPING Bilateral 1995  . vertebra Fracture  2000    Review of Systems + allergies, headaches, myalgia, back pain, aryhritis sxs, fatigue, cough akk other ros befative  Objective:   Physical Exam @BP  120/80   Pulse 68   Ht 5\' 3"  (1.6 m)   Wt 178 lb 6.4 oz (80.9 kg)   SpO2 98%   BMI 31.60 kg/m @  General:  Well-developed, well-nourished and in no acute distress - obese Eyes:  anicteric. ENT:   Mouth and posterior pharynx free of lesions.  Neck:   supple w/o thyromegaly or mass.  Lungs: Clear to auscultation bilaterally. Heart:   S1S2, no rubs, murmurs, gallops. Abdomen:  soft, non-tender, no hepatosplenomegaly, hernia, or mass and BS+.  Rectal: Deferred until colonoscopy Lymph:  no cervical or supraclavicular adenopathy. Extremities:   no edema, cyanosis or clubbing Skin   no rash. Neuro:  A&O x 3.  Psych:  appropriate  mood and  Affect.   Data Reviewed:  2006 EGD/colonoscopy PCP notes 2018 CBC, CMET N x glucose 136L

## 2016-05-31 ENCOUNTER — Encounter: Payer: Self-pay | Admitting: Internal Medicine

## 2016-08-25 DIAGNOSIS — M5126 Other intervertebral disc displacement, lumbar region: Secondary | ICD-10-CM | POA: Diagnosis not present

## 2016-08-25 DIAGNOSIS — M545 Low back pain: Secondary | ICD-10-CM | POA: Diagnosis not present

## 2016-08-25 DIAGNOSIS — M4316 Spondylolisthesis, lumbar region: Secondary | ICD-10-CM | POA: Diagnosis not present

## 2016-08-25 DIAGNOSIS — G932 Benign intracranial hypertension: Secondary | ICD-10-CM | POA: Diagnosis not present

## 2016-08-25 DIAGNOSIS — M4126 Other idiopathic scoliosis, lumbar region: Secondary | ICD-10-CM | POA: Diagnosis not present

## 2016-08-25 DIAGNOSIS — M5416 Radiculopathy, lumbar region: Secondary | ICD-10-CM | POA: Diagnosis not present

## 2016-09-02 ENCOUNTER — Other Ambulatory Visit: Payer: Self-pay | Admitting: Neurosurgery

## 2016-09-02 DIAGNOSIS — M5416 Radiculopathy, lumbar region: Secondary | ICD-10-CM

## 2016-09-14 ENCOUNTER — Ambulatory Visit
Admission: RE | Admit: 2016-09-14 | Discharge: 2016-09-14 | Disposition: A | Discharge status: Home / Self Care | Payer: BLUE CROSS/BLUE SHIELD | Source: Ambulatory Visit | Attending: Neurosurgery | Admitting: Neurosurgery

## 2016-09-14 DIAGNOSIS — M5126 Other intervertebral disc displacement, lumbar region: Secondary | ICD-10-CM | POA: Diagnosis not present

## 2016-09-14 DIAGNOSIS — M5416 Radiculopathy, lumbar region: Secondary | ICD-10-CM

## 2016-09-14 MED ORDER — GADOBENATE DIMEGLUMINE 529 MG/ML IV SOLN
15.0000 mL | Freq: Once | INTRAVENOUS | Status: AC | PRN
  Administered 2016-09-14: 15 mL via INTRAVENOUS

## 2016-10-27 DIAGNOSIS — I10 Essential (primary) hypertension: Secondary | ICD-10-CM | POA: Diagnosis not present

## 2016-10-27 DIAGNOSIS — Z6831 Body mass index (BMI) 31.0-31.9, adult: Secondary | ICD-10-CM | POA: Diagnosis not present

## 2016-10-27 DIAGNOSIS — G932 Benign intracranial hypertension: Secondary | ICD-10-CM | POA: Diagnosis not present

## 2016-10-27 DIAGNOSIS — M4126 Other idiopathic scoliosis, lumbar region: Secondary | ICD-10-CM | POA: Diagnosis not present

## 2016-10-27 DIAGNOSIS — M545 Low back pain: Secondary | ICD-10-CM | POA: Diagnosis not present

## 2016-10-27 DIAGNOSIS — M4316 Spondylolisthesis, lumbar region: Secondary | ICD-10-CM | POA: Diagnosis not present

## 2016-10-27 DIAGNOSIS — M5416 Radiculopathy, lumbar region: Secondary | ICD-10-CM | POA: Diagnosis not present

## 2016-11-04 DIAGNOSIS — M5416 Radiculopathy, lumbar region: Secondary | ICD-10-CM | POA: Diagnosis not present

## 2016-12-06 DIAGNOSIS — M4126 Other idiopathic scoliosis, lumbar region: Secondary | ICD-10-CM | POA: Diagnosis not present

## 2016-12-06 DIAGNOSIS — Z6832 Body mass index (BMI) 32.0-32.9, adult: Secondary | ICD-10-CM | POA: Diagnosis not present

## 2016-12-06 DIAGNOSIS — M5416 Radiculopathy, lumbar region: Secondary | ICD-10-CM | POA: Diagnosis not present

## 2016-12-06 DIAGNOSIS — M4316 Spondylolisthesis, lumbar region: Secondary | ICD-10-CM | POA: Diagnosis not present

## 2016-12-06 DIAGNOSIS — G932 Benign intracranial hypertension: Secondary | ICD-10-CM | POA: Diagnosis not present

## 2016-12-06 DIAGNOSIS — Z982 Presence of cerebrospinal fluid drainage device: Secondary | ICD-10-CM | POA: Diagnosis not present

## 2016-12-06 DIAGNOSIS — I10 Essential (primary) hypertension: Secondary | ICD-10-CM | POA: Diagnosis not present

## 2016-12-07 ENCOUNTER — Other Ambulatory Visit: Payer: Self-pay | Admitting: Neurosurgery

## 2016-12-27 DIAGNOSIS — E119 Type 2 diabetes mellitus without complications: Secondary | ICD-10-CM | POA: Diagnosis not present

## 2017-02-02 NOTE — Pre-Procedure Instructions (Signed)
Laurie Burch  02/02/2017      [place], [place]. [place]. [place] [address] Phone: [phone] Fax: [phone]  [facility], [place] [address] Phone: [phone] Fax: [phone]    Your procedure is scheduled on Fri. Jan. 4  Report to [facility] Admitting at 5:30  A.M.  Call this number if you have problems the morning of surgery:  [phone]   Remember:  Do not eat food or drink liquids after midnight.  Take these medicines the morning of surgery with A SIP OF WATER : albuterol inhaler if needed--bring to hospital, esomeprazole (nexium)              7 days prior to surgery STOP taking any Aspirin(unless otherwise instructed by your surgeon), Aleve, Naproxen, Ibuprofen, Motrin, Advil, Goody's, BC's, all herbal medications, fish oil, and all vitamins                 How to Manage Your Diabetes Before and After Surgery  Why is it important to control my blood sugar before and after surgery? . Improving blood sugar levels before and after surgery helps healing and can limit problems. . A way of improving blood sugar control is eating a healthy diet by: o  Eating less sugar and carbohydrates o  Increasing activity/exercise o  Talking with your doctor about reaching your blood sugar goals . High blood sugars (greater than 180 mg/dL) can raise your risk of infections and slow your recovery, so you will need to focus on controlling your diabetes during the weeks before surgery. . Make sure that the doctor who takes care of your diabetes knows about your planned surgery including the date and location.  How do I manage my blood sugar before surgery? . Check your blood sugar at least 4 times a day, starting 2 days before surgery, to make sure that the level is not too high or low. o Check your blood sugar the morning of  your surgery when you wake up and every 2 hours until you get to the Short Stay unit. . If your blood sugar is less than 70 mg/dL, you will need to treat for low blood sugar: o Do not take insulin. o Treat a low blood sugar (less than 70 mg/dL) with  cup of clear juice (cranberry or apple), 4 glucose tablets, OR glucose gel. Recheck blood sugar in 15 minutes after treatment (to make sure it is greater than 70 mg/dL). If your blood sugar is not greater than 70 mg/dL on recheck, call [phone] o  for further instructions. . Report your blood sugar to the short stay nurse when you get to Short Stay.  . If you are admitted to the hospital after surgery: o Your blood sugar will be checked by the staff and you will probably be given insulin after surgery (instead of oral diabetes medicines) to make sure you have good blood sugar levels. o The goal for blood sugar control after surgery is 80-180 mg/dL.     WHAT DO I DO ABOUT MY DIABETES MEDICATION?   Marland Kitchen Do not take oral diabetes medicines (pills) the morning of surgery.     Do not wear jewelry, make-up or nail polish.  Do not wear lotions, powders, or perfumes, or deodorant.  Do not shave 48 hours prior to surgery.  Men may shave face  and neck.  Do not bring valuables to the hospital.  [facility] is not responsible for any belongings or valuables.  Contacts, dentures or bridgework may not be worn into surgery.  Leave your suitcase in the car.  After surgery it may be brought to your room.  For patients admitted to the hospital, discharge time will be determined by your treatment team.  Patients discharged the day of surgery will not be allowed to drive home.    Special instructions:  [facility]- Preparing For Surgery  Before surgery, you can play an important role. Because skin is not sterile, your skin needs to be as free of germs as possible. You can reduce the number of germs on your skin by washing with CHG (chlorahexidine  gluconate) Soap before surgery.  CHG is an antiseptic cleaner which kills germs and bonds with the skin to continue killing germs even after washing.  Please do not use if you have an allergy to CHG or antibacterial soaps. If your skin becomes reddened/irritated stop using the CHG.  Do not shave (including legs and underarms) for at least 48 hours prior to first CHG shower. It is OK to shave your face.  Please follow these instructions carefully.   1. Shower the NIGHT BEFORE SURGERY and the MORNING OF SURGERY with CHG.   2. If you chose to wash your hair, wash your hair first as usual with your normal shampoo.  3. After you shampoo, rinse your hair and body thoroughly to remove the shampoo.  4. Use CHG as you would any other liquid soap. You can apply CHG directly to the skin and wash gently with a scrungie or a clean washcloth.   5. Apply the CHG Soap to your body ONLY FROM THE NECK DOWN.  Do not use on open wounds or open sores. Avoid contact with your eyes, ears, mouth and genitals (private parts). Wash Face and genitals (private parts)  with your normal soap.  6. Wash thoroughly, paying special attention to the area where your surgery will be performed.  7. Thoroughly rinse your body with warm water from the neck down.  8. DO NOT shower/wash with your normal soap after using and rinsing off the CHG Soap.  9. Pat yourself dry with a CLEAN TOWEL.  10. Wear CLEAN PAJAMAS to bed the night before surgery, wear comfortable clothes the morning of surgery  11. Place CLEAN SHEETS on your bed the night of your first shower and DO NOT SLEEP WITH PETS.    Day of Surgery: Do not apply any deodorants/lotions. Please wear clean clothes to the hospital/surgery center.      Please read over the following fact sheets that you were given. Coughing and Deep Breathing, MRSA Information and Surgical Site Infection Prevention

## 2017-02-03 ENCOUNTER — Encounter (HOSPITAL_COMMUNITY): Payer: Self-pay

## 2017-02-03 ENCOUNTER — Encounter (HOSPITAL_COMMUNITY)
Admission: RE | Admit: 2017-02-03 | Discharge: 2017-02-03 | Disposition: A | Discharge status: Home / Self Care | Payer: BLUE CROSS/BLUE SHIELD | Source: Ambulatory Visit | Attending: Neurosurgery | Admitting: Neurosurgery

## 2017-02-03 ENCOUNTER — Other Ambulatory Visit: Payer: Self-pay

## 2017-02-03 DIAGNOSIS — I10 Essential (primary) hypertension: Secondary | ICD-10-CM | POA: Diagnosis not present

## 2017-02-03 DIAGNOSIS — Z79899 Other long term (current) drug therapy: Secondary | ICD-10-CM | POA: Insufficient documentation

## 2017-02-03 DIAGNOSIS — Z7984 Long term (current) use of oral hypoglycemic drugs: Secondary | ICD-10-CM | POA: Insufficient documentation

## 2017-02-03 DIAGNOSIS — E119 Type 2 diabetes mellitus without complications: Secondary | ICD-10-CM | POA: Diagnosis not present

## 2017-02-03 DIAGNOSIS — K219 Gastro-esophageal reflux disease without esophagitis: Secondary | ICD-10-CM | POA: Diagnosis not present

## 2017-02-03 DIAGNOSIS — Z01810 Encounter for preprocedural cardiovascular examination: Secondary | ICD-10-CM | POA: Diagnosis not present

## 2017-02-03 DIAGNOSIS — E785 Hyperlipidemia, unspecified: Secondary | ICD-10-CM | POA: Diagnosis not present

## 2017-02-03 DIAGNOSIS — Z01812 Encounter for preprocedural laboratory examination: Secondary | ICD-10-CM | POA: Insufficient documentation

## 2017-02-03 LAB — CBC
HEMATOCRIT: 36.5 % (ref 36.0–46.0)
Hemoglobin: 12.2 g/dL (ref 12.0–15.0)
MCH: 29.1 pg (ref 26.0–34.0)
MCHC: 33.4 g/dL (ref 30.0–36.0)
MCV: 87.1 fL (ref 78.0–100.0)
PLATELETS: 310 10*3/uL (ref 150–400)
RBC: 4.19 MIL/uL (ref 3.87–5.11)
RDW: 13.3 % (ref 11.5–15.5)
WBC: 9.3 10*3/uL (ref 4.0–10.5)

## 2017-02-03 LAB — BASIC METABOLIC PANEL
Anion gap: 12 (ref 5–15)
BUN: 21 mg/dL — ABNORMAL HIGH (ref 6–20)
CALCIUM: 9.1 mg/dL (ref 8.9–10.3)
CO2: 25 mmol/L (ref 22–32)
CREATININE: 1.21 mg/dL — AB (ref 0.44–1.00)
Chloride: 101 mmol/L (ref 101–111)
GFR calc non Af Amer: 46 mL/min — ABNORMAL LOW (ref >60)
GFR, EST AFRICAN AMERICAN: 53 mL/min — AB (ref >60)
Glucose, Bld: 133 mg/dL — ABNORMAL HIGH (ref 65–99)
Potassium: 3.6 mmol/L (ref 3.5–5.1)
Sodium: 138 mmol/L (ref 135–145)

## 2017-02-03 LAB — HEMOGLOBIN A1C
HEMOGLOBIN A1C: 6.2 % — AB (ref 4.8–5.6)
Mean Plasma Glucose: 131.24 mg/dL

## 2017-02-03 LAB — GLUCOSE, CAPILLARY
GLUCOSE-CAPILLARY: 53 mg/dL — AB (ref 65–99)
GLUCOSE-CAPILLARY: 115 mg/dL — AB (ref 65–99)

## 2017-02-03 LAB — TYPE AND SCREEN
ABO/RH(D): O POS
Antibody Screen: NEGATIVE

## 2017-02-03 LAB — SURGICAL PCR SCREEN
MRSA, PCR: NEGATIVE
STAPHYLOCOCCUS AUREUS: NEGATIVE

## 2017-02-03 LAB — ABO/RH: ABO/RH(D): O POS

## 2017-02-03 NOTE — Pre-Procedure Instructions (Signed)
Laurie Burch  02/03/2017      [place], [address]. [place]. [place] [address] Phone: [phone] Fax: [phone]  [facility] [place], [place] [address] Phone: [phone] Fax: [phone]    Your procedure is scheduled on Fri. Jan. 4, 2019   Report to [facility] Admitting at 5:30  A.M.             (posted surgery time 7:30 a - 9:54 a)   Call this number if you have problems the morning of surgery:  [phone]   Remember:   Do not eat food or drink liquids after midnight, Thursday   Take these medicines the morning of surgery with A SIP OF WATER : albuterol inhaler if needed--bring to hospital, esomeprazole (nexium)              7 days prior to surgery STOP taking any Aspirin(unless otherwise instructed by your surgeon), Aleve, Naproxen, Ibuprofen, Motrin, Advil, Goody's, BC's, all herbal medications, fish oil, and all vitamins                 How to Manage Your Diabetes Before and After Surgery  Why is it important to control my blood sugar before and after surgery? . Improving blood sugar levels before and after surgery helps healing and can limit problems. . A way of improving blood sugar control is eating a healthy diet by: o  Eating less sugar and carbohydrates o  Increasing activity/exercise o  Talking with your doctor about reaching your blood sugar goals . High blood sugars (greater than 180 mg/dL) can raise your risk of infections and slow your recovery, so you will need to focus on controlling your diabetes during the weeks before surgery. . Make sure that the doctor who takes care of your diabetes knows about your planned surgery including the date and location.  How do I manage my blood sugar before surgery? . Check your blood sugar at least 4 times a day, starting 2 days before surgery, to make sure that the  level is not too high or low. o Check your blood sugar the morning of your surgery when you wake up and every 2 hours until you get to the Short Stay unit.  . If your blood sugar is less than 70 mg/dL, you will need to treat for low blood sugar: o Do not take insulin. o Treat a low blood sugar (less than 70 mg/dL) with  cup of clear juice (cranberry or apple), 4 glucose tablets, OR glucose gel.  Recheck blood sugar in 15 minutes after treatment (to make sure it is greater than 70 mg/dL). If your blood sugar is not greater than 70 mg/dL on recheck, call [phone] o  for further instructions. . Report your blood sugar to the short stay nurse when you get to Short Stay.  . If you are admitted to the hospital after surgery: o Your blood sugar will be checked by the staff and you will probably be given insulin after surgery (instead of oral diabetes medicines) to make sure you have good blood sugar levels. o The goal for blood sugar control after surgery is 80-180 mg/dL.  WHAT DO I DO ABOUT MY DIABETES MEDICATION?  Do not take oral diabetes medicines (pills) the morning of surgery.  Do not wear jewelry, make-up or nail polish.  Do not wear lotions, powders, or perfumes, or deodorant.  Do not shave 48 hours prior to surgery.    Do not bring valuables to the hospital.  [facility] is not responsible for any belongings or valuables.  Contacts, dentures or bridgework may not be worn into surgery.  Leave your suitcase in the car.  After surgery it may be brought to your room. For patients admitted to the hospital, discharge time will be determined by your treatment team.      [facility]- Preparing For Surgery  Before surgery, you can play an important role. Because skin is not sterile, your skin needs to be as free of germs as possible. You can reduce the number of germs on your skin by washing with CHG (chlorahexidine gluconate) Soap before surgery.  CHG is an antiseptic  cleaner which kills germs and bonds with the skin to continue killing germs even after washing.  Please do not use if you have an allergy to CHG or antibacterial soaps. If your skin becomes reddened/irritated stop using the CHG.  Do not shave (including legs and underarms) for at least 48 hours prior to first CHG shower. It is OK to shave your face.  Please follow these instructions carefully.   1. Shower the NIGHT BEFORE SURGERY and the MORNING OF SURGERY with CHG.   2. If you chose to wash your hair, wash your hair first as usual with your normal shampoo.  3. After you shampoo, rinse your hair and body thoroughly to remove the shampoo.  4. Use CHG as you would any other liquid soap. You can apply CHG directly to the skin and wash gently with a scrungie or a clean washcloth.   5. Apply the CHG Soap to your body ONLY FROM THE NECK DOWN.  Do not use on open wounds or open sores. Avoid contact with your eyes, ears, mouth and genitals (private parts). Wash Face and genitals (private parts)  with your normal soap.  6. Wash thoroughly, paying special attention to the area where your surgery will be performed.  7. Thoroughly rinse your body with warm water from the neck down.  8. DO NOT shower/wash with your normal soap after using and rinsing off the CHG Soap.  9. Pat yourself dry with a CLEAN TOWEL.  10. Wear CLEAN PAJAMAS to bed the night before surgery, wear comfortable clothes the morning of surgery  11. Place CLEAN SHEETS on your bed the night of your first shower and DO NOT SLEEP WITH PETS.    Day of Surgery: Do not apply any deodorants/lotions. Please wear clean clothes to the hospital/surgery center.      Please read over the following fact sheets that you were given. Coughing and Deep Breathing, MRSA Information and Surgical Site Infection Prevention

## 2017-02-03 NOTE — Progress Notes (Signed)
PCP is Dr. Minette Brine @ [facility] [phone])  Laurie Burch 07/2016 She states she saw cardio Dr. Candee Furbish 4-5 yrs ago for MVP & CP   (have requested notes from Dr. Kenton Kingfisher' office via fax) Durel Salts, NP was to check for cardio records.  Hasn't been back to see Dr. Marlou Porch since she didn't need his assistance. She only checks her blood sugars once a week.  Today she came in to PAT with a bs of 53.  Stated she had eaten lunch, but felt ok. Blood sugars range from 53 - 150.  Last A1C done in 07/2016.

## 2017-02-04 NOTE — Progress Notes (Signed)
Anesthesia Chart Review:  Pt is a 66 year old female scheduled for right L3-4 or lateral lumbar interbody fusion with plate on 10/13/766 with Erline Levine, M.D.  - PCP is Shirline Frees, MD - Saw cardiologist Candee Furbish, MD for evaluation of chest pain. Echo and stress test ordered, results below. PRN f/u recommended. (see correspondence 08/16/14 in media tab)  PMH includes: HTN, MVP (no murmur on exam by PCP 07/14/16), DM, hyperlipidemia pseudo-cerebral tumor (has shunt), asthma, post-up N/V, GERD. Never smoker. BMI 32. S/p lumbar decompression 08/14/14. S/p ACDF 06/07/13  Medications include: Albuterol, ASA 81 mg, bisoprolol-HCTZ, Nexium, glyburide, metformin, pravastatin, sitagliptin  BP 126/84   Pulse 64   Temp 36.6 C (Oral)   Resp 19   Ht 5\' 3"  (1.6 m)   Wt 178 lb 12.8 oz (81.1 kg)   SpO2 99%   BMI 31.67 kg/m   Preoperative labs reviewed.   - HbA1c 6.2, glucose 133  EKG 02/03/17: NSR. Low voltage QRS  Echo 01/03/12 [facility] cardiology): 1. Mild concentric LVH. 2. EF estimated at 60-65%. 3. No regional wall motion abnormalities. 4. Trace mitral valve regurgitation. 5. Trivial tricuspid regurgitation. 6. Mild calcification of the aortic valve. 7. Analysis of mitral valve inflow, pulmonary vein Doppler and tissue Doppler suggests grade I diastolic dysfunction without elevated left atrial pressure  Nuclear stress test 01/03/12 [facility] cardiology): By notes, normal EF, no ischemia, low risk  If no changes, I anticipate pt can proceed with surgery as scheduled.   Willeen Cass, FNP-BC [facility] Short Stay Surgical Center/Anesthesiology Phone: [phone] 02/04/2017 3:35 PM

## 2017-02-10 NOTE — H&P (Signed)
Patient ID:   [id number] Patient: Laurie Burch  Date of Birth: 1951/01/01 Visit Type: Office Visit   Date: 12/06/2016 10:30 AM Provider: Marchia Meiers. Vertell Limber MD   This 68 year old female presents for pain.   History of Present Illness: 1.  pain  Patient returns noting no relieve with injections. She notes continued lumbar and left leg pain. She notes new onset of right sided lumbar pain when she leans backwards.         Medical/Surgical/Interim History Reviewed, no change.  Last detailed document date:10/27/2016.     Family History: Reviewed, no changes.  Last detailed document date:10/27/2016.   Social History: Reviewed, no changes. Last detailed document date: 10/27/2016.    MEDICATIONS(added, continued or stopped this visit): Started Medication Directions Instruction Stopped   bisoprolol 2.5 mg-hydrochlorothiazide 6.25 mg tablet take 2 tablet by oral route  every day     diclofenac sodium 75 mg tablet,delayed release take 1 tablet by oral route 2 times every day     glyburide micronized 3 mg tablet take 1 tablet by oral route  every day in the morning with food     Januvia 100 mg tablet take 1 tablet by oral route  every day     metformin 500 mg tablet take 2 tablet by oral route 2 times every day with morning and evening meals    10/27/2016 Neurontin 300 mg capsule take 1 capsule by oral route 3 times every day as needed     pravastatin 40 mg tablet take 1 tablet by oral route  every day       ALLERGIES: Ingredient Reaction Medication Name Comment  NO KNOWN ALLERGIES     No known allergies. Reviewed, no changes.    Vitals Date Temp F BP Pulse Ht In Wt Lb BMI BSA Pain Score  12/06/2016  148/99 64 63 180.8 32.03  6/10  12/06/2016    63          IMPRESSION Patient returns to discuss surgical options. She reports no relief from injections. X-ray shows severe neural foraminal narrowing of left L3-4 with asymmetric disc space collapse at this level. Schedule  right-sided left L3-4 XLIF.  Completed Orders (this encounter) Order Details Reason Side Interpretation Result Initial Treatment Date Region  Lifestyle education regarding diet Encouraged patient to eat well balanced diet.        Lifestyle education Patient will follow up with Primary care physician.         Assessment/Plan # Detail Type Description   1. Assessment Body mass index (BMI) 32.0-32.9, adult (G18.29).   Plan Orders Today's instructions / counseling include(s) Lifestyle education regarding diet.       2. Assessment Essential (primary) hypertension (I10).           Pain Management Plan Pain Scale: 6/10. Method: Numeric Pain Intensity Scale. Location: back, hip, and leg. Onset: 02/26/2016. Duration: varies. Quality: discomforting. Pain management follow-up plan of care: Patient is taking OTC pain relievers for relief..   Schedule right-sided left L3-4 XLIF. Nurse education given. Fit for LSO brace.  Patient has functioning LP shunt, which we will do our best to avoid during surgery.  Orders: Instruction(s)/Education: Assessment Instruction  I10 Lifestyle education  [phone] Lifestyle education regarding diet             Provider:  Vertell Limber MD, Marchia Meiers 12/06/2016 11:19 AM  Dictation edited by: Lucita Lora    CC Providers: [facility] [phone] Viona Gilmore  [address] [place],  [place]  [address]-   James Love  [facility] [address] [place], [place] [address]-              Electronically signed by Marchia Meiers Vertell Limber MD on 12/07/2016 03:23 PM Patient ID:   [id number] Patient: Laurie Burch  Date of Birth: [birth year] Visit Type: Office Visit   Date: 10/27/2016 11:30 AM Provider: Marchia Meiers. Vertell Limber MD   This 67 year old female presents for MRI results.   History of Present Illness: 1.  MRI results  Patient returns as scheduled to review MRI results. She notes no improvement in lumbar and  left leg pain. She also complains of leg cramps at night.  MRI 09/15/16: Status post L1-2, L3-4, and L4-5 laminectomies. Adjacent segment disease L2-3. Moderate canal stenosis L2-3. Neural foraminal narrowing L2-3 through L4-5; severe on the left at L3-4. Moderate old T12 compression fracture.           MEDICATIONS(added, continued or stopped this visit): Started Medication Directions Instruction Stopped  10/27/2016 Neurontin 300 mg capsule take 1 capsule by oral route 3 times every day as needed       ALLERGIES: Ingredient Reaction Medication Name Comment  NO KNOWN ALLERGIES     No known allergies. Reviewed, no changes.    Vitals Date Temp F BP Pulse Ht In Wt Lb BMI BSA Pain Score  10/27/2016  154/85 60 63 179.8 31.85  5/10      IMPRESSION Patient returns as scheduled to review MRI results. MRI shows severe neural foraminal narrowing of left L3-4. X-ray shows loss of disc height on the left L3-4. Shunt noted on the right. Schedule left L3 SNRB. Prescribe Neurontin. If injection does not resolve the pain, schedule right sided XLIF L3-4 with right sided plate with attention to not disrupt the right peritoneal shunt.     Pain Management Plan Pain Scale: 5/10. Method: Numeric Pain Intensity Scale. Location: back. Onset: 02/26/2016. Duration: varies. Quality: discomforting. Pain management follow-up plan of care: Patient taking medication as prescribed..  Schedule left L3 SNRB. Prescribe Neurontin. Follow-up prn.    MEDICATIONS PRESCRIBED TODAY    Rx Quantity Refills  NEURONTIN 300 mg  90 2            Provider:  Vertell Limber MD, Marchia Meiers 10/27/2016 12:42 PM  Dictation edited by: Lucita Lora    CC Providers: [facility] [place],  [place]  [address]-   James Love  [facility] [address] [place], [place] [address]-              Electronically signed by Marchia Meiers  Vertell Limber MD on 10/27/2016 05:21 PM  Patient ID:   [id number] Patient: Laurie Burch  Date of Birth: April 17, 1950 Visit Type: Office Visit   Date: 08/25/2016 02:30 PM Provider: Marchia Meiers. Vertell Limber MD   This 67 year old female presents for Hip pain.   History of Present Illness: 1.  Hip pain  Laurie Burch, 67 year old female employed with [employer] returns for evaluation of lumbar and left buttock radiating to the leg pain numbness and tingling increasing over the past year.  She reports no significant improvement since lumbar decompression in 2016 by Dr. Rolena Infante.  She notes increased pain with walking.  Chiropractic treatments x1 year has offered no relief Hip injection x2 offered no relief  Diclofenac 75 mg 1 per day helps little (  decreased from b.i.d. due to kidney function)  History:  HTN, NIDDM, cholesterol, hiatal hernia, gastritis, pseudotumor, heart murmur, mitral valve prolapse Surgical history:  Tonsillectomy 1968, appendectomy 1976, malleolus fracture with fixation 1978, C-section 1981, 1984 lumbar peritoneal shunt, myomectomy abdominal approach 1985, hysterectomy 1989, sent of ectomy a flexor tendon 1992, varicose veins 1995, knee scope 1996, shunt revision 2001 by Dr. Vertell Limber, ACDF C5-6, C6-7 2008 by Dr. Vertell Limber, cervical surgery 2014 by Dr. Rolena Infante, lumbar surgery 2016 by Dr. Rolena Infante  L23 Tuohy needle with a new tube with the old tube connected to the abdominal catheter.  Lumbar x-rays on canopy show catheter entering spinal canal at L 23 level with intraspinal catheter.           MEDICATIONS(added, continued or stopped this visit):   ALLERGIES:   Review of Systems System Neg/Pos Details  Constitutional Negative Chills, Fatigue, Fever, Malaise, Night sweats, Weight gain and Weight loss.  ENMT Negative Ear drainage, Hearing loss, Nasal drainage, Otalgia, Sinus pressure and Sore throat.  Eyes Negative Eye discharge, Eye pain and Vision changes.  Respiratory  Negative Chronic cough, Cough, Dyspnea, Known TB exposure and Wheezing.  Cardio Negative Chest pain, Claudication, Edema and Irregular heartbeat/palpitations.  GI Negative Abdominal pain, Blood in stool, Change in stool pattern, Constipation, Decreased appetite, Diarrhea, Heartburn, Nausea and Vomiting.  GU Negative Dysuria, Hematuria, Polyuria (Genitourinary), Urinary frequency, Urinary incontinence and Urinary retention.  Endocrine Negative Cold intolerance, Heat intolerance, Polydipsia and Polyphagia.  Neuro Positive Gait disturbance, Numbness in extremity.  Psych Negative Anxiety, Depression and Insomnia.  Integumentary Negative Brittle hair, Brittle nails, Change in shape/size of mole(s), Hair loss, Hirsutism, Hives, Pruritus, Rash and Skin lesion.  MS Positive Back pain.  Hema/Lymph Negative Easy bleeding, Easy bruising and Lymphadenopathy.  Allergic/Immuno Negative Contact allergy, Environmental allergies, Food allergies and Seasonal allergies.  Reproductive Negative Breast discharge, Breast lumps, Dysmenorrhea, Dyspareunia, History of abnormal PAP smear, Hot flashes, Irregular menses and Vaginal discharge.   Vitals Date Temp F BP Pulse Ht In Wt Lb BMI BSA Pain Score  08/25/2016  149/86 72 63 179.6 31.81  5/10     PHYSICAL EXAM General Level of Distress: no acute distress Overall Appearance: normal  Head and Face  Right Left  Fundoscopic Exam:  normal normal    Cardiovascular Cardiac: regular rate and rhythm without murmur  Right Left  Carotid Pulses: normal normal  Respiratory Lungs: clear to auscultation  Neurological Orientation: normal Recent and Remote Memory: normal Attention Span and Concentration:   normal Language: normal Fund of Knowledge: normal  Right Left Sensation: normal normal Upper Extremity Coordination: normal normal  Lower Extremity Coordination: normal normal  Musculoskeletal Gait and Station: normal  Right Left Upper Extremity Muscle  Strength: normal normal Lower Extremity Muscle Strength: normal normal Upper Extremity Muscle Tone:  normal normal Lower Extremity Muscle Tone: normal normal   Motor Strength Upper and lower extremity motor strength was tested in the clinically pertinent muscles.     Deep Tendon Reflexes  Right Left Biceps: normal normal Triceps: normal normal Brachioradialis: normal normal Patellar: normal normal Achilles: normal normal  Cranial Nerves II. Optic Nerve/Visual Fields: normal III. Oculomotor: normal IV. Trochlear: normal V. Trigeminal: normal VI. Abducens: normal VII. Facial: normal VIII. Acoustic/Vestibular: normal IX. Glossopharyngeal: normal X. Vagus: normal XI. Spinal Accessory: normal XII. Hypoglossal: normal  Motor and other Tests Lhermittes: negative Rhomberg: negative Pronator drift: absent     Right Left Hoffman's: normal normal Clonus: normal normal Babinski: normal normal   Additional Findings:  Low back pain, left sciatic pain. Able to extend within 4 inches of the floor. Fundoscopic exam is normal. Cranial nerve exam is normal. Full strength in upper and lower extremities. Positive seated SLR on the left. Reflexes are symmetric. Shunt tubing is palpable on right side of abdomen.    IMPRESSION Patient presents with lumbar and left hip radiating through the leg pain, numbness, and tingling. X-ray shows lumboperitoneal shunt for a pseudo tumor and previous laminectomy of L4 and L5. Scoliosis and lateral listhesis of L3 on L4 with foraminal narrowing on the left and lateral listhesis of L2 on L3 with foraminal narrowing on the left. Intrapedicular distance is 13.3 mm on the left and 20 mm on the right at L3-4. On confrontational testing, positive seated SLR on the left, normal strength. Shunt tubing is palpable on right side of abdomen. Schedule an MRI of the lumbar spine for further evaluation.  Physical: Low back pain, left sciatic pain. Able to extend  within 4 inches of the floor. Fundoscopic exam is normal. Cranial nerve exam is normal. Full strength in upper and lower extremities. Positive seated SLR on the left. Reflexes are symmetric. Shunt tubing is palpable on right side of flank and abdomen.  Completed Orders (this encounter) Order Details Reason Side Interpretation Result Initial Treatment Date Region  Lumbar Spine- AP/Lat/Flex/Ex      08/25/2016 All Levels to All Levels   Assessment/Plan # Detail Type Description   1. Assessment Lumbar radiculopathy (M54.16).           Pain Management Plan Pain Scale: 5/10. Method: Numeric Pain Intensity Scale. Location: hip. Onset: 02/26/2016. Duration: varies. Quality: discomforting. Pain management follow-up plan of care: Patient is not using any methods to relieve pain..  Schedule MRI of the lumbar spine. Follow-up after MRI.  Orders: Diagnostic Procedures: Assessment Procedure  M54.16 Lumbar Spine- AP/Lat/Flex/Ex             Provider:  Vertell Limber MD, Marchia Meiers 08/25/2016 3:36 PM  Dictation edited by: Lucita Lora    CC Providers: [facility] [place],  [place]  [address]-   James Love  [facility] [address] [place], [place] [address]-              Electronically signed by Marchia Meiers Vertell Limber MD on 08/28/2016 01:58 PM

## 2017-02-11 ENCOUNTER — Encounter (HOSPITAL_COMMUNITY)
Admission: RE | Disposition: A | Discharge status: Home / Self Care | Payer: Self-pay | Source: Ambulatory Visit | Attending: Neurosurgery

## 2017-02-11 ENCOUNTER — Other Ambulatory Visit: Payer: Self-pay

## 2017-02-11 ENCOUNTER — Inpatient Hospital Stay (HOSPITAL_COMMUNITY): Payer: BLUE CROSS/BLUE SHIELD | Admitting: Anesthesiology

## 2017-02-11 ENCOUNTER — Inpatient Hospital Stay (HOSPITAL_COMMUNITY): Payer: BLUE CROSS/BLUE SHIELD | Admitting: Emergency Medicine

## 2017-02-11 ENCOUNTER — Inpatient Hospital Stay (HOSPITAL_COMMUNITY): Payer: BLUE CROSS/BLUE SHIELD

## 2017-02-11 ENCOUNTER — Encounter (HOSPITAL_COMMUNITY): Payer: Self-pay | Admitting: Anesthesiology

## 2017-02-11 ENCOUNTER — Inpatient Hospital Stay (HOSPITAL_COMMUNITY)
Admission: RE | Admit: 2017-02-11 | Discharge: 2017-02-12 | DRG: 460 | Disposition: A | Discharge status: Home / Self Care | Payer: BLUE CROSS/BLUE SHIELD | Source: Ambulatory Visit | Attending: Neurosurgery | Admitting: Neurosurgery

## 2017-02-11 DIAGNOSIS — Z791 Long term (current) use of non-steroidal anti-inflammatories (NSAID): Secondary | ICD-10-CM | POA: Diagnosis not present

## 2017-02-11 DIAGNOSIS — J45909 Unspecified asthma, uncomplicated: Secondary | ICD-10-CM | POA: Diagnosis present

## 2017-02-11 DIAGNOSIS — M48061 Spinal stenosis, lumbar region without neurogenic claudication: Secondary | ICD-10-CM | POA: Diagnosis not present

## 2017-02-11 DIAGNOSIS — M48062 Spinal stenosis, lumbar region with neurogenic claudication: Secondary | ICD-10-CM | POA: Diagnosis not present

## 2017-02-11 DIAGNOSIS — Z419 Encounter for procedure for purposes other than remedying health state, unspecified: Secondary | ICD-10-CM

## 2017-02-11 DIAGNOSIS — Z79899 Other long term (current) drug therapy: Secondary | ICD-10-CM | POA: Diagnosis not present

## 2017-02-11 DIAGNOSIS — E785 Hyperlipidemia, unspecified: Secondary | ICD-10-CM | POA: Diagnosis present

## 2017-02-11 DIAGNOSIS — M545 Low back pain: Secondary | ICD-10-CM | POA: Diagnosis not present

## 2017-02-11 DIAGNOSIS — I10 Essential (primary) hypertension: Secondary | ICD-10-CM | POA: Diagnosis not present

## 2017-02-11 DIAGNOSIS — M5416 Radiculopathy, lumbar region: Secondary | ICD-10-CM | POA: Diagnosis not present

## 2017-02-11 DIAGNOSIS — E119 Type 2 diabetes mellitus without complications: Secondary | ICD-10-CM | POA: Diagnosis not present

## 2017-02-11 DIAGNOSIS — M4126 Other idiopathic scoliosis, lumbar region: Secondary | ICD-10-CM | POA: Diagnosis not present

## 2017-02-11 DIAGNOSIS — M419 Scoliosis, unspecified: Secondary | ICD-10-CM | POA: Diagnosis present

## 2017-02-11 DIAGNOSIS — M4326 Fusion of spine, lumbar region: Secondary | ICD-10-CM | POA: Diagnosis not present

## 2017-02-11 DIAGNOSIS — Z90710 Acquired absence of both cervix and uterus: Secondary | ICD-10-CM

## 2017-02-11 DIAGNOSIS — Z7984 Long term (current) use of oral hypoglycemic drugs: Secondary | ICD-10-CM | POA: Diagnosis not present

## 2017-02-11 HISTORY — PX: ANTERIOR LAT LUMBAR FUSION: SHX1168

## 2017-02-11 LAB — GLUCOSE, CAPILLARY
GLUCOSE-CAPILLARY: 117 mg/dL — AB (ref 65–99)
GLUCOSE-CAPILLARY: 145 mg/dL — AB (ref 65–99)
Glucose-Capillary: 107 mg/dL — ABNORMAL HIGH (ref 65–99)
Glucose-Capillary: 145 mg/dL — ABNORMAL HIGH (ref 65–99)
Glucose-Capillary: 223 mg/dL — ABNORMAL HIGH (ref 65–99)

## 2017-02-11 SURGERY — ANTERIOR LATERAL LUMBAR FUSION 1 LEVEL
Anesthesia: General | Laterality: Right

## 2017-02-11 MED ORDER — HYDROCODONE-ACETAMINOPHEN 5-325 MG PO TABS
ORAL_TABLET | ORAL | Status: AC
  Administered 2017-02-11: 2 via ORAL
  Filled 2017-02-11: qty 2

## 2017-02-11 MED ORDER — BUPIVACAINE HCL (PF) 0.5 % IJ SOLN
INTRAMUSCULAR | Status: AC
  Filled 2017-02-11: qty 30

## 2017-02-11 MED ORDER — ONDANSETRON HCL 4 MG PO TABS: 4.0000 mg | ORAL_TABLET | Freq: Four times a day (QID) | ORAL | Status: DC | PRN

## 2017-02-11 MED ORDER — LACTATED RINGERS IV SOLN
INTRAVENOUS | Status: DC | PRN
  Administered 2017-02-11: 07:00:00 via INTRAVENOUS

## 2017-02-11 MED ORDER — METHOCARBAMOL 500 MG PO TABS
ORAL_TABLET | ORAL | Status: AC
  Administered 2017-02-11: 500 mg via ORAL
  Filled 2017-02-11: qty 1

## 2017-02-11 MED ORDER — GABAPENTIN 300 MG PO CAPS
300.0000 mg | ORAL_CAPSULE | Freq: Every day | ORAL | Status: DC
  Administered 2017-02-11: 300 mg via ORAL
  Filled 2017-02-11: qty 1

## 2017-02-11 MED ORDER — DOCUSATE SODIUM 100 MG PO CAPS
100.0000 mg | ORAL_CAPSULE | Freq: Two times a day (BID) | ORAL | Status: DC
  Administered 2017-02-11 – 2017-02-12 (×3): 100 mg via ORAL
  Filled 2017-02-11 (×3): qty 1

## 2017-02-11 MED ORDER — LIDOCAINE HCL (CARDIAC) 20 MG/ML IV SOLN
INTRAVENOUS | Status: DC | PRN
  Administered 2017-02-11: 50 mg via INTRAVENOUS

## 2017-02-11 MED ORDER — LINAGLIPTIN 5 MG PO TABS
5.0000 mg | ORAL_TABLET | Freq: Every day | ORAL | Status: DC
  Administered 2017-02-11 – 2017-02-12 (×2): 5 mg via ORAL
  Filled 2017-02-11 (×2): qty 1

## 2017-02-11 MED ORDER — MEPERIDINE HCL 25 MG/ML IJ SOLN: 6.2500 mg | INTRAMUSCULAR | Status: DC | PRN

## 2017-02-11 MED ORDER — POLYETHYLENE GLYCOL 3350 17 G PO PACK
17.0000 g | PACK | Freq: Every day | ORAL | Status: DC
  Administered 2017-02-11 – 2017-02-12 (×2): 17 g via ORAL
  Filled 2017-02-11 (×2): qty 1

## 2017-02-11 MED ORDER — MENTHOL 3 MG MT LOZG: 1.0000 | LOZENGE | OROMUCOSAL | Status: DC | PRN

## 2017-02-11 MED ORDER — LIDOCAINE-EPINEPHRINE 1 %-1:100000 IJ SOLN
INTRAMUSCULAR | Status: AC
  Filled 2017-02-11: qty 1

## 2017-02-11 MED ORDER — FENTANYL CITRATE (PF) 100 MCG/2ML IJ SOLN
INTRAMUSCULAR | Status: DC | PRN
  Administered 2017-02-11 (×3): 50 ug via INTRAVENOUS

## 2017-02-11 MED ORDER — MIDAZOLAM HCL 2 MG/2ML IJ SOLN
INTRAMUSCULAR | Status: AC
  Filled 2017-02-11: qty 2

## 2017-02-11 MED ORDER — METHOCARBAMOL 500 MG PO TABS
500.0000 mg | ORAL_TABLET | Freq: Four times a day (QID) | ORAL | Status: DC | PRN
  Administered 2017-02-11 – 2017-02-12 (×5): 500 mg via ORAL
  Filled 2017-02-11 (×4): qty 1

## 2017-02-11 MED ORDER — CEFAZOLIN SODIUM-DEXTROSE 2-4 GM/100ML-% IV SOLN
INTRAVENOUS | Status: AC
  Filled 2017-02-11: qty 100

## 2017-02-11 MED ORDER — THROMBIN (RECOMBINANT) 20000 UNITS EX SOLR
CUTANEOUS | Status: AC
  Filled 2017-02-11: qty 20000

## 2017-02-11 MED ORDER — GLYBURIDE MICRONIZED 3 MG PO TABS
3.0000 mg | ORAL_TABLET | Freq: Every day | ORAL | Status: DC
  Administered 2017-02-11: 3 mg via ORAL
  Filled 2017-02-11: qty 1

## 2017-02-11 MED ORDER — FENTANYL CITRATE (PF) 250 MCG/5ML IJ SOLN
INTRAMUSCULAR | Status: AC
  Filled 2017-02-11: qty 5

## 2017-02-11 MED ORDER — PHENOL 1.4 % MT LIQD: 1.0000 | OROMUCOSAL | Status: DC | PRN

## 2017-02-11 MED ORDER — PANTOPRAZOLE SODIUM 40 MG PO TBEC: 40.0000 mg | DELAYED_RELEASE_TABLET | Freq: Every day | ORAL | Status: DC

## 2017-02-11 MED ORDER — OMEGA-3-ACID ETHYL ESTERS 1 G PO CAPS
1.0000 g | ORAL_CAPSULE | Freq: Every day | ORAL | Status: DC
  Administered 2017-02-11 – 2017-02-12 (×2): 1 g via ORAL
  Filled 2017-02-11 (×2): qty 1

## 2017-02-11 MED ORDER — THROMBIN (RECOMBINANT) 5000 UNITS EX SOLR
CUTANEOUS | Status: DC | PRN
  Administered 2017-02-11: 5000 [IU] via TOPICAL

## 2017-02-11 MED ORDER — DEXAMETHASONE SODIUM PHOSPHATE 10 MG/ML IJ SOLN
INTRAMUSCULAR | Status: DC | PRN
  Administered 2017-02-11: 10 mg via INTRAVENOUS

## 2017-02-11 MED ORDER — ONDANSETRON HCL 4 MG/2ML IJ SOLN
INTRAMUSCULAR | Status: AC
  Filled 2017-02-11: qty 2

## 2017-02-11 MED ORDER — EPHEDRINE SULFATE 50 MG/ML IJ SOLN
INTRAMUSCULAR | Status: DC | PRN
  Administered 2017-02-11: 5 mg via INTRAVENOUS
  Administered 2017-02-11: 10 mg via INTRAVENOUS
  Administered 2017-02-11: 5 mg via INTRAVENOUS

## 2017-02-11 MED ORDER — ACETAMINOPHEN 650 MG RE SUPP: 650.0000 mg | RECTAL | Status: DC | PRN

## 2017-02-11 MED ORDER — CHLORHEXIDINE GLUCONATE CLOTH 2 % EX PADS: 6.0000 | MEDICATED_PAD | Freq: Once | CUTANEOUS | Status: DC

## 2017-02-11 MED ORDER — CEFAZOLIN SODIUM-DEXTROSE 2-4 GM/100ML-% IV SOLN
2.0000 g | Freq: Three times a day (TID) | INTRAVENOUS | Status: AC
  Administered 2017-02-11 (×2): 2 g via INTRAVENOUS
  Filled 2017-02-11 (×2): qty 100

## 2017-02-11 MED ORDER — ONDANSETRON HCL 4 MG/2ML IJ SOLN
INTRAMUSCULAR | Status: DC | PRN
  Administered 2017-02-11: 4 mg via INTRAVENOUS

## 2017-02-11 MED ORDER — HEMOSTATIC AGENTS (NO CHARGE) OPTIME
TOPICAL | Status: DC | PRN
  Administered 2017-02-11: 1 via TOPICAL

## 2017-02-11 MED ORDER — 0.9 % SODIUM CHLORIDE (POUR BTL) OPTIME
TOPICAL | Status: DC | PRN
  Administered 2017-02-11: 1000 mL

## 2017-02-11 MED ORDER — PROPOFOL 500 MG/50ML IV EMUL
INTRAVENOUS | Status: DC | PRN
  Administered 2017-02-11: 50 ug/kg/min via INTRAVENOUS

## 2017-02-11 MED ORDER — PROMETHAZINE HCL 25 MG/ML IJ SOLN: 6.2500 mg | INTRAMUSCULAR | Status: DC | PRN

## 2017-02-11 MED ORDER — DEXAMETHASONE SODIUM PHOSPHATE 10 MG/ML IJ SOLN
INTRAMUSCULAR | Status: AC
  Filled 2017-02-11: qty 1

## 2017-02-11 MED ORDER — GLYBURIDE MICRONIZED 3 MG PO TABS
6.0000 mg | ORAL_TABLET | Freq: Every day | ORAL | Status: DC
  Administered 2017-02-12: 6 mg via ORAL
  Filled 2017-02-11: qty 2

## 2017-02-11 MED ORDER — SODIUM CHLORIDE 0.9% FLUSH: 3.0000 mL | INTRAVENOUS | Status: DC | PRN

## 2017-02-11 MED ORDER — BISACODYL 10 MG RE SUPP: 10.0000 mg | Freq: Every day | RECTAL | Status: DC | PRN

## 2017-02-11 MED ORDER — MORPHINE SULFATE (PF) 2 MG/ML IV SOLN: 2.0000 mg | INTRAVENOUS | Status: DC | PRN

## 2017-02-11 MED ORDER — SENNOSIDES-DOCUSATE SODIUM 8.6-50 MG PO TABS: 1.0000 | ORAL_TABLET | Freq: Every evening | ORAL | Status: DC | PRN

## 2017-02-11 MED ORDER — PRAVASTATIN SODIUM 80 MG PO TABS
80.0000 mg | ORAL_TABLET | Freq: Every day | ORAL | Status: DC
  Administered 2017-02-11: 80 mg via ORAL
  Filled 2017-02-11: qty 1

## 2017-02-11 MED ORDER — LIDOCAINE-EPINEPHRINE 1 %-1:100000 IJ SOLN
INTRAMUSCULAR | Status: DC | PRN
  Administered 2017-02-11: 9 mL

## 2017-02-11 MED ORDER — DEXTROSE 5 % IV SOLN
INTRAVENOUS | Status: DC | PRN
  Administered 2017-02-11: 20 ug/min via INTRAVENOUS

## 2017-02-11 MED ORDER — INSULIN ASPART 100 UNIT/ML ~~LOC~~ SOLN
0.0000 [IU] | Freq: Three times a day (TID) | SUBCUTANEOUS | Status: DC
  Administered 2017-02-11 (×2): 2 [IU] via SUBCUTANEOUS

## 2017-02-11 MED ORDER — PHENYLEPHRINE HCL 10 MG/ML IJ SOLN
INTRAMUSCULAR | Status: DC | PRN
  Administered 2017-02-11: 80 ug via INTRAVENOUS
  Administered 2017-02-11: 40 ug via INTRAVENOUS

## 2017-02-11 MED ORDER — HYDROMORPHONE HCL 1 MG/ML IJ SOLN
0.2500 mg | INTRAMUSCULAR | Status: DC | PRN
  Administered 2017-02-11 (×4): 0.5 mg via INTRAVENOUS

## 2017-02-11 MED ORDER — BISOPROLOL-HYDROCHLOROTHIAZIDE 2.5-6.25 MG PO TABS
2.0000 | ORAL_TABLET | Freq: Every day | ORAL | Status: DC
  Administered 2017-02-11 – 2017-02-12 (×2): 2 via ORAL
  Filled 2017-02-11 (×2): qty 2

## 2017-02-11 MED ORDER — CEFAZOLIN SODIUM-DEXTROSE 2-4 GM/100ML-% IV SOLN
2.0000 g | INTRAVENOUS | Status: AC
  Administered 2017-02-11: 2 g via INTRAVENOUS

## 2017-02-11 MED ORDER — INSULIN ASPART 100 UNIT/ML ~~LOC~~ SOLN
4.0000 [IU] | Freq: Three times a day (TID) | SUBCUTANEOUS | Status: DC
  Administered 2017-02-11 – 2017-02-12 (×3): 4 [IU] via SUBCUTANEOUS

## 2017-02-11 MED ORDER — LABETALOL HCL 5 MG/ML IV SOLN
INTRAVENOUS | Status: DC | PRN
  Administered 2017-02-11: 5 mg via INTRAVENOUS

## 2017-02-11 MED ORDER — KCL IN DEXTROSE-NACL 20-5-0.45 MEQ/L-%-% IV SOLN: INTRAVENOUS | Status: DC

## 2017-02-11 MED ORDER — PROPOFOL 10 MG/ML IV BOLUS
INTRAVENOUS | Status: AC
  Filled 2017-02-11: qty 40

## 2017-02-11 MED ORDER — MORPHINE SULFATE (PF) 4 MG/ML IV SOLN: 2.0000 mg | INTRAVENOUS | Status: DC | PRN

## 2017-02-11 MED ORDER — SUCCINYLCHOLINE CHLORIDE 200 MG/10ML IV SOSY
PREFILLED_SYRINGE | INTRAVENOUS | Status: AC
  Filled 2017-02-11: qty 10

## 2017-02-11 MED ORDER — MAGNESIUM OXIDE 400 (241.3 MG) MG PO TABS
400.0000 mg | ORAL_TABLET | Freq: Every day | ORAL | Status: DC
  Administered 2017-02-11 – 2017-02-12 (×2): 400 mg via ORAL
  Filled 2017-02-11 (×2): qty 1

## 2017-02-11 MED ORDER — METHOCARBAMOL 1000 MG/10ML IJ SOLN
500.0000 mg | Freq: Four times a day (QID) | INTRAVENOUS | Status: DC | PRN
  Filled 2017-02-11: qty 5

## 2017-02-11 MED ORDER — ONDANSETRON HCL 4 MG/2ML IJ SOLN: 4.0000 mg | Freq: Four times a day (QID) | INTRAMUSCULAR | Status: DC | PRN

## 2017-02-11 MED ORDER — ALUM & MAG HYDROXIDE-SIMETH 200-200-20 MG/5ML PO SUSP: 30.0000 mL | Freq: Four times a day (QID) | ORAL | Status: DC | PRN

## 2017-02-11 MED ORDER — PROPOFOL 10 MG/ML IV BOLUS
INTRAVENOUS | Status: DC | PRN
  Administered 2017-02-11: 140 mg via INTRAVENOUS

## 2017-02-11 MED ORDER — METFORMIN HCL ER 500 MG PO TB24
1000.0000 mg | ORAL_TABLET | Freq: Two times a day (BID) | ORAL | Status: DC
  Administered 2017-02-11 – 2017-02-12 (×2): 1000 mg via ORAL
  Filled 2017-02-11 (×2): qty 2

## 2017-02-11 MED ORDER — IRON 18 MG PO TBCR: EXTENDED_RELEASE_TABLET | Freq: Every day | ORAL | Status: DC

## 2017-02-11 MED ORDER — ALBUTEROL SULFATE (2.5 MG/3ML) 0.083% IN NEBU: 3.0000 mL | INHALATION_SOLUTION | Freq: Four times a day (QID) | RESPIRATORY_TRACT | Status: DC | PRN

## 2017-02-11 MED ORDER — SODIUM CHLORIDE 0.9% FLUSH
3.0000 mL | Freq: Two times a day (BID) | INTRAVENOUS | Status: DC
  Administered 2017-02-11 (×2): 3 mL via INTRAVENOUS

## 2017-02-11 MED ORDER — SCOPOLAMINE 1 MG/3DAYS TD PT72
MEDICATED_PATCH | TRANSDERMAL | Status: DC | PRN
  Administered 2017-02-11: 1 via TRANSDERMAL

## 2017-02-11 MED ORDER — ADULT MULTIVITAMIN W/MINERALS CH
1.0000 | ORAL_TABLET | Freq: Every day | ORAL | Status: DC
  Administered 2017-02-11 – 2017-02-12 (×2): 1 via ORAL
  Filled 2017-02-11 (×4): qty 1

## 2017-02-11 MED ORDER — THROMBIN (RECOMBINANT) 5000 UNITS EX SOLR
CUTANEOUS | Status: AC
  Filled 2017-02-11: qty 5000

## 2017-02-11 MED ORDER — ACETAMINOPHEN 325 MG PO TABS: 650.0000 mg | ORAL_TABLET | ORAL | Status: DC | PRN

## 2017-02-11 MED ORDER — OXYCODONE HCL 5 MG PO TABS
5.0000 mg | ORAL_TABLET | ORAL | Status: DC | PRN
  Administered 2017-02-11 – 2017-02-12 (×5): 5 mg via ORAL
  Filled 2017-02-11 (×5): qty 1

## 2017-02-11 MED ORDER — LACTATED RINGERS IV SOLN: INTRAVENOUS | Status: DC

## 2017-02-11 MED ORDER — MIDAZOLAM HCL 5 MG/5ML IJ SOLN
INTRAMUSCULAR | Status: DC | PRN
  Administered 2017-02-11: 2 mg via INTRAVENOUS

## 2017-02-11 MED ORDER — CINNAMON 500 MG PO TABS: 500.0000 mg | ORAL_TABLET | Freq: Every day | ORAL | Status: DC

## 2017-02-11 MED ORDER — HYDROMORPHONE HCL 1 MG/ML IJ SOLN
INTRAMUSCULAR | Status: AC
  Administered 2017-02-11: 0.5 mg via INTRAVENOUS
  Filled 2017-02-11: qty 1

## 2017-02-11 MED ORDER — ZOLPIDEM TARTRATE 5 MG PO TABS: 5.0000 mg | ORAL_TABLET | Freq: Every evening | ORAL | Status: DC | PRN

## 2017-02-11 MED ORDER — INSULIN ASPART 100 UNIT/ML ~~LOC~~ SOLN
0.0000 [IU] | Freq: Every day | SUBCUTANEOUS | Status: DC
  Administered 2017-02-11: 2 [IU] via SUBCUTANEOUS

## 2017-02-11 MED ORDER — ASPIRIN EC 81 MG PO TBEC
81.0000 mg | DELAYED_RELEASE_TABLET | Freq: Every day | ORAL | Status: DC
  Administered 2017-02-11 – 2017-02-12 (×2): 81 mg via ORAL
  Filled 2017-02-11 (×2): qty 1

## 2017-02-11 MED ORDER — BUPIVACAINE HCL (PF) 0.5 % IJ SOLN
INTRAMUSCULAR | Status: DC | PRN
  Administered 2017-02-11: 9 mL

## 2017-02-11 MED ORDER — HYDROCODONE-ACETAMINOPHEN 5-325 MG PO TABS
2.0000 | ORAL_TABLET | ORAL | Status: DC | PRN
  Administered 2017-02-11: 2 via ORAL

## 2017-02-11 SURGICAL SUPPLY — 60 items
BLADE CLIPPER SURG (BLADE) IMPLANT
BOLT SPNL LRG 45X5.5XPLAT NS (Screw) ×2 IMPLANT
CAGE MODULUS XL 10X18X50 - 10 (Cage) ×3 IMPLANT
CARTRIDGE OIL MAESTRO DRILL (MISCELLANEOUS) IMPLANT
COVER BACK TABLE 24X17X13 BIG (DRAPES) IMPLANT
DECANTER SPIKE VIAL GLASS SM (MISCELLANEOUS) ×3 IMPLANT
DERMABOND ADVANCED (GAUZE/BANDAGES/DRESSINGS) ×4
DERMABOND ADVANCED .7 DNX12 (GAUZE/BANDAGES/DRESSINGS) ×2 IMPLANT
DIFFUSER DRILL AIR PNEUMATIC (MISCELLANEOUS) IMPLANT
DRAPE C-ARM 42X72 X-RAY (DRAPES) ×3 IMPLANT
DRAPE C-ARMOR (DRAPES) ×3 IMPLANT
DRAPE LAPAROTOMY 100X72X124 (DRAPES) ×3 IMPLANT
DRAPE POUCH INSTRU U-SHP 10X18 (DRAPES) ×3 IMPLANT
DRSG OPSITE POSTOP 3X4 (GAUZE/BANDAGES/DRESSINGS) ×6 IMPLANT
DURAPREP 26ML APPLICATOR (WOUND CARE) ×3 IMPLANT
ELECT REM PT RETURN 9FT ADLT (ELECTROSURGICAL) ×3
ELECTRODE REM PT RTRN 9FT ADLT (ELECTROSURGICAL) ×1 IMPLANT
GAUZE SPONGE 4X4 16PLY XRAY LF (GAUZE/BANDAGES/DRESSINGS) IMPLANT
GLOVE BIO SURGEON STRL SZ8 (GLOVE) ×3 IMPLANT
GLOVE BIOGEL PI IND STRL 8 (GLOVE) ×1 IMPLANT
GLOVE BIOGEL PI IND STRL 8.5 (GLOVE) ×1 IMPLANT
GLOVE BIOGEL PI INDICATOR 8 (GLOVE) ×2
GLOVE BIOGEL PI INDICATOR 8.5 (GLOVE) ×2
GLOVE ECLIPSE 8.0 STRL XLNG CF (GLOVE) ×3 IMPLANT
GLOVE EXAM NITRILE LRG STRL (GLOVE) IMPLANT
GLOVE EXAM NITRILE XL STR (GLOVE) IMPLANT
GLOVE EXAM NITRILE XS STR PU (GLOVE) IMPLANT
GLOVE INDICATOR 7.0 STRL GRN (GLOVE) ×3 IMPLANT
GLOVE INDICATOR 7.5 STRL GRN (GLOVE) ×3 IMPLANT
GOWN STRL REUS W/ TWL LRG LVL3 (GOWN DISPOSABLE) IMPLANT
GOWN STRL REUS W/ TWL XL LVL3 (GOWN DISPOSABLE) ×1 IMPLANT
GOWN STRL REUS W/TWL 2XL LVL3 (GOWN DISPOSABLE) ×9 IMPLANT
GOWN STRL REUS W/TWL LRG LVL3 (GOWN DISPOSABLE)
GOWN STRL REUS W/TWL XL LVL3 (GOWN DISPOSABLE) ×2
HEMOSTAT POWDER SURGIFOAM 1G (HEMOSTASIS) ×3 IMPLANT
KIT BASIN OR (CUSTOM PROCEDURE TRAY) ×3 IMPLANT
KIT DILATOR XLIF 5 (KITS) ×2 IMPLANT
KIT INFUSE XX SMALL 0.7CC (Orthopedic Implant) ×3 IMPLANT
KIT ROOM TURNOVER OR (KITS) ×3 IMPLANT
KIT SURGICAL ACCESS MAXCESS 4 (KITS) ×3 IMPLANT
KIT XLIF (KITS) ×1
MODULE NVM5 NEXT GEN EMG (NEEDLE) ×3 IMPLANT
NEEDLE HYPO 25X1 1.5 SAFETY (NEEDLE) ×3 IMPLANT
NS IRRIG 1000ML POUR BTL (IV SOLUTION) ×3 IMPLANT
OIL CARTRIDGE MAESTRO DRILL (MISCELLANEOUS)
PACK LAMINECTOMY NEURO (CUSTOM PROCEDURE TRAY) ×3 IMPLANT
PLATE 2H 10MM (Plate) ×3 IMPLANT
PUTTY BONE ATTRAX 5CC STRIP (Putty) ×3 IMPLANT
SCREW 45MM (Screw) ×4 IMPLANT
SPONGE LAP 4X18 X RAY DECT (DISPOSABLE) IMPLANT
SPONGE SURGIFOAM ABS GEL SZ50 (HEMOSTASIS) IMPLANT
STAPLER SKIN PROX WIDE 3.9 (STAPLE) ×3 IMPLANT
SUT VIC AB 1 CT1 18XBRD ANBCTR (SUTURE) ×1 IMPLANT
SUT VIC AB 1 CT1 8-18 (SUTURE) ×2
SUT VIC AB 2-0 CT1 18 (SUTURE) ×3 IMPLANT
SUT VIC AB 3-0 SH 8-18 (SUTURE) ×3 IMPLANT
TOWEL GREEN STERILE (TOWEL DISPOSABLE) ×3 IMPLANT
TOWEL GREEN STERILE FF (TOWEL DISPOSABLE) ×3 IMPLANT
TRAY FOLEY W/METER SILVER 16FR (SET/KITS/TRAYS/PACK) ×3 IMPLANT
WATER STERILE IRR 1000ML POUR (IV SOLUTION) ×3 IMPLANT

## 2017-02-11 NOTE — Anesthesia Postprocedure Evaluation (Signed)
Anesthesia Post Note  Patient: Laurie Burch  Procedure(s) Performed: Right Lumbar three-four Anterolateral lumbar interbody fusion with plate (Right )     Patient location during evaluation: PACU Anesthesia Type: General Level of consciousness: awake and alert Pain management: pain level controlled Vital Signs Assessment: post-procedure vital signs reviewed and stable Respiratory status: spontaneous breathing, nonlabored ventilation, respiratory function stable and patient connected to nasal cannula oxygen Cardiovascular status: blood pressure returned to baseline and stable Postop Assessment: no apparent nausea or vomiting Anesthetic complications: no    Last Vitals:  Vitals:   02/11/17 1048 02/11/17 1050  BP: 117/75   Pulse: 88 (!) 58  Resp: 12 (!) 9  Temp:  (!) 36.3 C  SpO2: 94% 100%                  Effie Berkshire

## 2017-02-11 NOTE — Interval H&P Note (Signed)
History and Physical Interval Note:  02/11/2017 7:26 AM  Laurie Burch  has presented today for surgery, with the diagnosis of Idiopathic scoliosis of lumbar region  The various methods of treatment have been discussed with the patient and family. After consideration of risks, benefits and other options for treatment, the patient has consented to  Procedure(s) with comments: Right L3-4 Anterolateral lumbar interbody fusion with plate (Right) - Right L3-4 Anterolateral lumbar interbody fusion with plate as a surgical intervention .  The patient's history has been reviewed, patient examined, no change in status, stable for surgery.  I have reviewed the patient's chart and labs.  Questions were answered to the patient's satisfaction.     [name]

## 2017-02-11 NOTE — Evaluation (Signed)
Physical Therapy Evaluation Patient Details Name: Laurie Burch MRN: [id number] DOB: Feb 06, 1951 Today's Date: 02/11/2017   History of Present Illness  Pt is a 67 y/o female s/p L3-4 ALIF. PMH includes HTN, heart murmur, asthma, DM.   Clinical Impression  Patient is s/p above surgery resulting in the deficits listed below (see PT Problem List). PTA, pt was independent with functional mobility. Upon eval, pt presenting with post op pain, new L hip pain, and slightly decreased balance. Required use of IV pole and min guard assist for mobility. Pt slightly guarded during gait secondary to L hip pain. Reports husband can assist at d/c and does not need any DME. Patient will benefit from skilled PT to increase their independence and safety with mobility (while adhering to their precautions) to allow discharge to the venue listed below. Will continue to follow acutely to maximize functional mobility independence and safety.      Follow Up Recommendations No PT follow up;Supervision for mobility/OOB    Equipment Recommendations  None recommended by PT    Recommendations for Other Services       Precautions / Restrictions Precautions Precautions: Back Precaution Booklet Issued: Yes (comment) Precaution Comments: Reviewed back precautions with pt.  Required Braces or Orthoses: Spinal Brace Spinal Brace: Lumbar corset;Applied in sitting position Restrictions Weight Bearing Restrictions: No      Mobility  Bed Mobility Overal bed mobility: Needs Assistance Bed Mobility: Rolling;Sidelying to Sit;Sit to Sidelying Rolling: Supervision Sidelying to sit: Supervision     Sit to sidelying: Supervision General bed mobility comments: Supervision to ensure log roll technique. Verbal cues for sequencing.   Transfers Overall transfer level: Needs assistance Equipment used: None Transfers: Sit to/from Stand Sit to Stand: Min guard         General transfer comment: Min guard for safety.    Ambulation/Gait Ambulation/Gait assistance: Min guard Ambulation Distance (Feet): 200 Feet Assistive device: (IV pole ) Gait Pattern/deviations: Step-through pattern;Decreased stride length;Antalgic Gait velocity: Decreased Gait velocity interpretation: Below normal speed for age/gender General Gait Details: Antalgic gait secondary to reported R hip pain. Guarded when taking steps with RLE. Reports feeling groggy and used IV pole for steadying. Educated about generalized walking program to perform at home.   Stairs            Wheelchair Mobility    Modified Rankin (Stroke Patients Only)       Balance Overall balance assessment: Needs assistance Sitting-balance support: No upper extremity supported;Feet supported Sitting balance-Leahy Scale: Good     Standing balance support: Single extremity supported;No upper extremity supported;During functional activity Standing balance-Leahy Scale: Fair Standing balance comment: Able to maintain static standing without UE support.                              Pertinent Vitals/Pain Pain Assessment: 0-10 Pain Score: 3  Pain Descriptors / Indicators: Aching;Operative site guarding Pain Intervention(s): Limited activity within patient's tolerance;Monitored during session;Repositioned    Home Living Family/patient expects to be discharged to:: Private residence Living Arrangements: Spouse/significant other Available Help at Discharge: Family;Available PRN/intermittently Type of Home: House Home Access: Stairs to enter Entrance Stairs-Rails: None Entrance Stairs-Number of Steps: 1(threshold ) Home Layout: One level Home Equipment: Bedside commode      Prior Function Level of Independence: Independent         Comments: Still working 40+ hours a week      Hand Dominance  Extremity/Trunk Assessment   Upper Extremity Assessment Upper Extremity Assessment: Defer to OT evaluation    Lower Extremity  Assessment Lower Extremity Assessment: RLE deficits/detail RLE Deficits / Details: R hip pain following surgery. Guarded ambulation.  LLE Deficits / Details: Reports LLE deficits had improved.     Cervical / Trunk Assessment Cervical / Trunk Assessment: Other exceptions Cervical / Trunk Exceptions: s/p ALIF   Communication   Communication: No difficulties  Cognition Arousal/Alertness: Awake/alert Behavior During Therapy: WFL for tasks assessed/performed Overall Cognitive Status: Within Functional Limits for tasks assessed                                        General Comments      Exercises     Assessment/Plan    PT Assessment Patient needs continued PT services  PT Problem List Decreased strength;Decreased balance;Decreased mobility;Decreased knowledge of precautions;Pain       PT Treatment Interventions Gait training;Stair training;Functional mobility training;Therapeutic exercise;Therapeutic activities;Balance training;Neuromuscular re-education;Patient/family education    PT Goals (Current goals can be found in the Care Plan section)  Acute Rehab PT Goals Patient Stated Goal: to go home  PT Goal Formulation: With patient Time For Goal Achievement: 02/18/17 Potential to Achieve Goals: Good    Frequency Min 5X/week   Barriers to discharge        Co-evaluation               AM-PAC PT "6 Clicks" Daily Activity  Outcome Measure Difficulty turning over in bed (including adjusting bedclothes, sheets and blankets)?: A Little Difficulty moving from lying on back to sitting on the side of the bed? : A Little Difficulty sitting down on and standing up from a chair with arms (e.g., wheelchair, bedside commode, etc,.)?: Unable Help needed moving to and from a bed to chair (including a wheelchair)?: A Little Help needed walking in hospital room?: A Little Help needed climbing 3-5 steps with a railing? : A Little 6 Click Score: 16    End of  Session Equipment Utilized During Treatment: Gait belt;Back brace Activity Tolerance: Patient tolerated treatment well Patient left: in bed;with call bell/phone within reach Nurse Communication: Mobility status PT Visit Diagnosis: Other abnormalities of gait and mobility (R26.89);Pain Pain - Right/Left: Right Pain - part of body: Hip(back )    Time: 1250-1313 PT Time Calculation (min) (ACUTE ONLY): 23 min   Charges:   PT Evaluation $PT Eval Low Complexity: 1 Low PT Treatments $Gait Training: 8-22 mins   PT G Codes:        Leighton Ruff, PT, DPT  Acute Rehabilitation Services  Pager: [phone]   Rudean Hitt 02/11/2017, 1:36 PM

## 2017-02-11 NOTE — Anesthesia Procedure Notes (Signed)
Procedure Name: Intubation Date/Time: 02/11/2017 7:56 AM Performed by: Scheryl Darter, CRNA Pre-anesthesia Checklist: Patient identified, Emergency Drugs available, Suction available and Patient being monitored Patient Re-evaluated:Patient Re-evaluated prior to induction Oxygen Delivery Method: Circle System Utilized Preoxygenation: Pre-oxygenation with 100% oxygen Induction Type: IV induction Ventilation: Mask ventilation without difficulty Laryngoscope Size: Glidescope Grade View: Grade I Tube type: Oral Tube size: 7.0 mm Number of attempts: 1 Airway Equipment and Method: Stylet and Oral airway Placement Confirmation: ETT inserted through vocal cords under direct vision,  positive ETCO2 and breath sounds checked- equal and bilateral Secured at: 21 cm Tube secured with: Tape Dental Injury: Teeth and Oropharynx as per pre-operative assessment

## 2017-02-11 NOTE — Transfer of Care (Signed)
Immediate Anesthesia Transfer of Care Note  Patient: Laurie Burch  Procedure(s) Performed: Right Lumbar three-four Anterolateral lumbar interbody fusion with plate (Right )  Patient Location: PACU  Anesthesia Type:General  Level of Consciousness: awake, alert , oriented and sedated  Airway & Oxygen Therapy: Patient Spontanous Breathing and Patient connected to nasal cannula oxygen  Post-op Assessment: Report given to RN, Post -op Vital signs reviewed and stable and Patient moving all extremities  Post vital signs: Reviewed and stable  Last Vitals:  Vitals:   02/11/17 0640  BP: (!) 144/78  Pulse: 73  Resp: 17  Temp: 36.8 C  SpO2: 97%    Last Pain:  Vitals:   02/11/17 0640  TempSrc: Oral  PainSc: 4       Patients Stated Pain Goal: 3 (65/46/50 3546)  Complications: No apparent anesthesia complications

## 2017-02-11 NOTE — Brief Op Note (Signed)
02/11/2017  9:24 AM  PATIENT:  Laurie Burch  67 y.o. female  PRE-OPERATIVE DIAGNOSIS:  Idiopathic scoliosis of lumbar region, lumbar foraminal stenosis, lumbar radiculopathy, lumbago L 34 level  POST-OPERATIVE DIAGNOSIS:  Idiopathic scoliosis of lumbar region, lumbar foraminal stenosis, lumbar radiculopathy, lumbago L 34 level   PROCEDURE:  Procedure(s): Right Lumbar three-four Anterolateral lumbar interbody fusion with plate (Right)  SURGEON:  Surgeon(s) and Role:    Erline Levine, MD - Primary  PHYSICIAN ASSISTANT:   ASSISTANTS: Poteat, RN   ANESTHESIA:   general  EBL:  15 mL   BLOOD ADMINISTERED:none  DRAINS: none   LOCAL MEDICATIONS USED:  MARCAINE    and LIDOCAINE   SPECIMEN:  No Specimen  DISPOSITION OF SPECIMEN:  N/A  COUNTS:  YES  TOURNIQUET:  * No tourniquets in log *  DICTATION: Patient is a 67 year old with severe spondylosis stenosis and scoliosis of the lumbar spine with lateral spodylolisthesis. It was elected to take her to surgery for anterolateral decompression and lateral plate fixation at the L 34 level.  She had previously undergone laminectomy of L 4 levels.  Procedure: Patient was brought to the operating room and placed in a left lateral decubitus position on the operative table and using orthogonally projected C-arm fluoroscopy the patient was placed so that the L 34  level was visualized in AP and lateral plane. The patient was then taped into position. The table was flexed so as to expose the L 34 level. Skin was marked along with a posterior finger dissection incision. Her flank was then prepped and draped in usual sterile fashion and incisions were made overlying the L 34 level and being careful to avoid previously placed LP shunt. Posterior finger dissection was made to enter the retroperitoneal space and then subsequently the probe was inserted into the psoas muscle from the right side initially at the L 34 level. After mapping the neural  elements were able to dock the probe per the posterior 1/3 of this vertebral level and without indications electrically of too close proximity to the neural tissues. Subsequently the self-retaining tractor was.after sequential dilators were utilized the shim was employed and the interspace was cleared of psoas muscle and then incised. A thorough discectomy was performed. Instruments were used to clear the interspace of disc material. After thorough discectomy was performed and this was performed using AP and lateral fluoroscopy a 10 lordotic by 50 x 18 mm implant was packed with extra extra small BMP and Attrax. This was tamped into position using the slides and its position was confirmed on AP and lateral fluoroscopy. A Decade two hole lateral plate (size 10) was affixed to the lateral spine with 5.0 x 45 mm screws at each vertebral level. All screws were torque locked and positioning was confirmed with AP and lateral fluoroscopy. . Hemostasis was assured the wounds were irrigated interrupted Vicryl sutures.Sterile occlusive dressing was placed with Dermabond and occlusive dressings. The patient was then extubated in the operating room and taken to recovery in stable and satisfactory condition having tolerated his operation well. Counts were correct at the end of the case.  PLAN OF CARE: Admit to inpatient   PATIENT DISPOSITION:  PACU - hemodynamically stable.   Delay start of Pharmacological VTE agent (>24hrs) due to surgical blood loss or risk of bleeding: yes

## 2017-02-11 NOTE — Anesthesia Preprocedure Evaluation (Addendum)
Anesthesia Evaluation  Patient identified by MRN, date of birth, ID band Patient awake    Reviewed: Allergy & Precautions, NPO status , Patient's Chart, lab work & pertinent test results  History of Anesthesia Complications (+) PONV and history of anesthetic complications  Airway Mallampati: II  TM Distance: >3 FB Neck ROM: Full    Dental  (+) Teeth Intact, Dental Advisory Given, Caps,    Pulmonary asthma ,    breath sounds clear to auscultation       Cardiovascular hypertension, + Valvular Problems/Murmurs MVP  Rhythm:Regular Rate:Normal     Neuro/Psych  Headaches,  Neuromuscular disease negative psych ROS   GI/Hepatic GERD  Medicated,  Endo/Other  diabetes, Type 2, Oral Hypoglycemic Agents  Renal/GU   negative genitourinary   Musculoskeletal  (+) Arthritis ,   Abdominal   Peds  Hematology   Anesthesia Other Findings - HLD  Reproductive/Obstetrics negative OB ROS                            Anesthesia Physical Anesthesia Plan  ASA: III  Anesthesia Plan: General   Post-op Pain Management:    Induction: Intravenous  PONV Risk Score and Plan: 4 or greater and Ondansetron, Dexamethasone, Midazolam, Treatment may vary due to age or medical condition and Scopolamine patch - Pre-op  Airway Management Planned: Oral ETT  Additional Equipment: None  Intra-op Plan:   Post-operative Plan: Extubation in OR  Informed Consent: I have reviewed the patients History and Physical, chart, labs and discussed the procedure including the risks, benefits and alternatives for the proposed anesthesia with the patient or authorized representative who has indicated his/her understanding and acceptance.   Dental advisory given  Plan Discussed with: CRNA  Anesthesia Plan Comments:         Anesthesia Quick Evaluation

## 2017-02-11 NOTE — Op Note (Signed)
02/11/2017  9:24 AM  PATIENT:  Laurie Burch  67 y.o. female  PRE-OPERATIVE DIAGNOSIS:  Idiopathic scoliosis of lumbar region, lumbar foraminal stenosis, lumbar radiculopathy, lumbago L 34 level  POST-OPERATIVE DIAGNOSIS:  Idiopathic scoliosis of lumbar region, lumbar foraminal stenosis, lumbar radiculopathy, lumbago L 34 level   PROCEDURE:  Procedure(s): Right Lumbar three-four Anterolateral lumbar interbody fusion with plate (Right)  SURGEON:  Surgeon(s) and Role:    Erline Levine, MD - Primary  PHYSICIAN ASSISTANT:   ASSISTANTS: Poteat, RN   ANESTHESIA:   general  EBL:  15 mL   BLOOD ADMINISTERED:none  DRAINS: none   LOCAL MEDICATIONS USED:  MARCAINE    and LIDOCAINE   SPECIMEN:  No Specimen  DISPOSITION OF SPECIMEN:  N/A  COUNTS:  YES  TOURNIQUET:  * No tourniquets in log *  DICTATION: Patient is a 67 year old with severe spondylosis stenosis and scoliosis of the lumbar spine with lateral spodylolisthesis. It was elected to take her to surgery for anterolateral decompression and lateral plate fixation at the L 34 level.  She had previously undergone laminectomy of L 4 levels.  Procedure: Patient was brought to the operating room and placed in a left lateral decubitus position on the operative table and using orthogonally projected C-arm fluoroscopy the patient was placed so that the L 34  level was visualized in AP and lateral plane. The patient was then taped into position. The table was flexed so as to expose the L 34 level. Skin was marked along with a posterior finger dissection incision. Her flank was then prepped and draped in usual sterile fashion and incisions were made overlying the L 34 level and being careful to avoid previously placed LP shunt. Posterior finger dissection was made to enter the retroperitoneal space and then subsequently the probe was inserted into the psoas muscle from the right side initially at the L 34 level. After mapping the neural  elements were able to dock the probe per the posterior 1/3 of this vertebral level and without indications electrically of too close proximity to the neural tissues. Subsequently the self-retaining tractor was.after sequential dilators were utilized the shim was employed and the interspace was cleared of psoas muscle and then incised. A thorough discectomy was performed. Instruments were used to clear the interspace of disc material. After thorough discectomy was performed and this was performed using AP and lateral fluoroscopy a 10 lordotic by 50 x 18 mm implant was packed with extra extra small BMP and Attrax. This was tamped into position using the slides and its position was confirmed on AP and lateral fluoroscopy. A Decade two hole lateral plate (size 10) was affixed to the lateral spine with 5.0 x 45 mm screws at each vertebral level. All screws were torque locked and positioning was confirmed with AP and lateral fluoroscopy. . Hemostasis was assured the wounds were irrigated interrupted Vicryl sutures.Sterile occlusive dressing was placed with Dermabond and occlusive dressings. The patient was then extubated in the operating room and taken to recovery in stable and satisfactory condition having tolerated his operation well. Counts were correct at the end of the case.  PLAN OF CARE: Admit to inpatient   PATIENT DISPOSITION:  PACU - hemodynamically stable.   Delay start of Pharmacological VTE agent (>24hrs) due to surgical blood loss or risk of bleeding: yes

## 2017-02-12 LAB — GLUCOSE, CAPILLARY: Glucose-Capillary: 95 mg/dL (ref 65–99)

## 2017-02-12 MED ORDER — OXYCODONE HCL 5 MG PO TABS: 5.0000 mg | ORAL_TABLET | ORAL | 0 refills | Status: DC | PRN

## 2017-02-12 MED ORDER — METHOCARBAMOL 750 MG PO TABS: 375.0000 mg | ORAL_TABLET | Freq: Four times a day (QID) | ORAL | 1 refills | Status: DC | PRN

## 2017-02-12 MED ORDER — GABAPENTIN 300 MG PO CAPS: 300.0000 mg | ORAL_CAPSULE | Freq: Three times a day (TID) | ORAL | 2 refills | Status: AC

## 2017-02-12 NOTE — Discharge Summary (Signed)
Physician Discharge Summary  Patient ID: LETHER TESCH MRN: [id number] DOB/AGE: 1950/05/18 67 y.o.  Admit date: 02/11/2017 Discharge date: 02/12/2017  Admission Diagnoses:Lumbar scoliosis, stenosis, radiculopathy, lumbago, spondylolisthesis  Discharge Diagnoses: Lumbar scoliosis, stenosis, radiculopathy, lumbago, spondylolisthesis Active Problems:   Lumbar spine scoliosis   Discharged Condition: good  Hospital Course: Patient underwent anterolateral decompression and fusion L 34 level with lateral plating for assymetric disc space collapse and recurrent lumbar stenosis L 34 level.  She did well with surgery and was discharged home on am of POD 1.  Consults: None  Significant Diagnostic Studies: None  Treatments: surgery: Patient underwent anterolateral decompression and fusion L 34 level with lateral plating for assymetric disc space collapse and recurrent lumbar stenosis L 34 level  Discharge Exam: Blood pressure (!) 101/53, pulse 65, temperature 98.3 F (36.8 C), temperature source Oral, resp. rate 18, height 5\' 3"  (1.6 m), weight 80.7 kg (178 lb), SpO2 94 %. Neurologic: Alert and oriented X 3, normal strength and tone. Normal symmetric reflexes. Normal coordination and gait Wound:CDI  Disposition: Home  Discharge Instructions    Diet - low sodium heart healthy   Complete by:  As directed    Increase activity slowly   Complete by:  As directed      Allergies as of 02/12/2017      Reactions   Crestor [rosuvastatin Calcium] Other (See Comments)   Muscle pain, fatigue   Irbesartan Other (See Comments)   Myalgias, Body aches   Lipitor [atorvastatin] Other (See Comments)   Muscle pain, fatigue   Lisinopril Cough   Glucotrol [glipizide]    UNSPECIFIED REACTION     Prandin [repaglinide]    UNSPECIFIED REACTION    Prednisone    UNSPECIFIED REACTION AT LARGE DOSES   Simvastatin Other (See Comments)   Muscle aches, fatigue   Prilosec [omeprazole] Rash, Other (See Comments)    INTOLERANCE >  GAS      Medication List    STOP taking these medications   diclofenac 75 MG EC tablet Commonly known as:  VOLTAREN     TAKE these medications   albuterol 108 (90 Base) MCG/ACT inhaler Commonly known as:  PROVENTIL HFA;VENTOLIN HFA Inhale 2 puffs into the lungs 4 (four) times daily as needed for wheezing or shortness of breath.   aspirin EC 81 MG tablet Take 81 mg by mouth daily.   bisoprolol-hydrochlorothiazide 2.5-6.25 MG tablet Commonly known as:  ZIAC Take 2 tablets by mouth daily.   Cinnamon 500 MG Tabs Take 500 mg by mouth daily.   esomeprazole 40 MG capsule Commonly known as:  NEXIUM Take 1 capsule (40 mg total) by mouth daily before breakfast.   gabapentin 300 MG capsule Commonly known as:  NEURONTIN Take 1 capsule (300 mg total) by mouth 3 (three) times daily. What changed:  when to take this   glyBURIDE micronized 3 MG tablet Commonly known as:  GLYNASE Take 3-6 mg by mouth See admin instructions. Take 2 tablets in the morning, then 1 tablet in the afternoon   IRON PO Take 1 tablet by mouth daily. 65mg    magnesium oxide 400 MG tablet Commonly known as:  MAG-OX Take 400 mg by mouth daily.   metFORMIN 500 MG 24 hr tablet Commonly known as:  GLUCOPHAGE-XR Take 1,000 mg by mouth 2 (two) times daily.   methocarbamol 750 MG tablet Commonly known as:  ROBAXIN Take 0.5 tablets (375 mg total) by mouth every 6 (six) hours as needed for muscle spasms.  multivitamin with minerals tablet Take 1 tablet by mouth daily.   omega-3 acid ethyl esters 1 g capsule Commonly known as:  LOVAZA Take 1 g by mouth daily. MEGA RED   oxyCODONE 5 MG immediate release tablet Commonly known as:  Oxy IR/ROXICODONE Take 1-2 tablets (5-10 mg total) by mouth every 3 (three) hours as needed for moderate pain ((score 4 to 6)).   pravastatin 80 MG tablet Commonly known as:  PRAVACHOL Take 80 mg by mouth at bedtime.   sitaGLIPtin 100 MG tablet Commonly known  as:  JANUVIA Take 100 mg by mouth daily.        Signed: Peggyann Shoals, MD 02/12/2017, 6:44 AM

## 2017-02-12 NOTE — Progress Notes (Signed)
Physical Therapy Treatment Patient Details Name: Laurie GAUTHREAUX MRN: [id number] DOB: 1950/02/10 Today's Date: 02/12/2017    History of Present Illness Pt is a 67 y/o female s/p L3-4 ALIF. PMH includes HTN, heart murmur, asthma, DM.     PT Comments    Patient is making good progress with PT.  From a mobility standpoint anticipate patient will be ready for DC home when medically ready.    Follow Up Recommendations  No PT follow up;Supervision for mobility/OOB     Equipment Recommendations  None recommended by PT    Recommendations for Other Services       Precautions / Restrictions Precautions Precautions: Back Precaution Booklet Issued: Yes (comment) Precaution Comments: Reviewed back precautions with pt.  Required Braces or Orthoses: Spinal Brace Spinal Brace: Lumbar corset;Applied in sitting position Restrictions Weight Bearing Restrictions: No    Mobility  Bed Mobility Overal bed mobility: Needs Assistance Bed Mobility: Rolling;Sidelying to Sit;Sit to Sidelying Rolling: Supervision Sidelying to sit: Supervision     Sit to sidelying: Supervision General bed mobility comments: pt OOB in chair upon arrival  Transfers Overall transfer level: Needs assistance Equipment used: None Transfers: Sit to/from Stand Sit to Stand: Supervision         General transfer comment: supervision for safety  Ambulation/Gait Ambulation/Gait assistance: Supervision Ambulation Distance (Feet): 400 Feet Assistive device: None Gait Pattern/deviations: Step-through pattern;Decreased stride length;Antalgic Gait velocity: Decreased   General Gait Details: cues for maintaining back precautions and increased stride length   Stairs            Wheelchair Mobility    Modified Rankin (Stroke Patients Only)       Balance Overall balance assessment: Needs assistance Sitting-balance support: No upper extremity supported;Feet supported Sitting balance-Leahy Scale: Good      Standing balance support: No upper extremity supported;During functional activity Standing balance-Leahy Scale: Fair Standing balance comment: Able to maintain static standing without UE support.                             Cognition Arousal/Alertness: Awake/alert Behavior During Therapy: WFL for tasks assessed/performed Overall Cognitive Status: Within Functional Limits for tasks assessed                                        Exercises      General Comments General comments (skin integrity, edema, etc.): Reviewed back precautions handout      Pertinent Vitals/Pain Pain Assessment: Faces Faces Pain Scale: Hurts a little bit Pain Location: Back Pain Descriptors / Indicators: Operative site guarding;Discomfort Pain Intervention(s): Monitored during session;Repositioned;Premedicated before session    Home Living Family/patient expects to be discharged to:: Private residence Living Arrangements: Spouse/significant other Available Help at Discharge: Family;Available PRN/intermittently Type of Home: House Home Access: Stairs to enter Entrance Stairs-Rails: None Home Layout: One level Home Equipment: Bedside commode      Prior Function Level of Independence: Independent      Comments: Still working 40+ hours a week    PT Goals (current goals can now be found in the care plan section) Acute Rehab PT Goals Patient Stated Goal: to go home  PT Goal Formulation: With patient Time For Goal Achievement: 02/18/17 Potential to Achieve Goals: Good Progress towards PT goals: Progressing toward goals    Frequency    Min 5X/week      PT  Plan Current plan remains appropriate    Co-evaluation              AM-PAC PT "6 Clicks" Daily Activity  Outcome Measure  Difficulty turning over in bed (including adjusting bedclothes, sheets and blankets)?: A Little Difficulty moving from lying on back to sitting on the side of the bed? : A  Little Difficulty sitting down on and standing up from a chair with arms (e.g., wheelchair, bedside commode, etc,.)?: A Lot Help needed moving to and from a bed to chair (including a wheelchair)?: None Help needed walking in hospital room?: A Little Help needed climbing 3-5 steps with a railing? : A Little 6 Click Score: 18    End of Session Equipment Utilized During Treatment: Gait belt;Back brace Activity Tolerance: Patient tolerated treatment well Patient left: with call bell/phone within reach;in chair Nurse Communication: Mobility status PT Visit Diagnosis: Other abnormalities of gait and mobility (R26.89);Pain Pain - Right/Left: Right Pain - part of body: Hip(back )     Time: 2336-1224 PT Time Calculation (min) (ACUTE ONLY): 13 min  Charges:  $Gait Training: 8-22 mins                    G Codes:       Earney Navy, PTA Pager: [phone]     Darliss Cheney 02/12/2017, 9:54 AM

## 2017-02-12 NOTE — Progress Notes (Signed)
Subjective: Patient reports doing well  Objective: Vital signs in last 24 hours: Temp:  [97.4 F (36.3 C)-98.3 F (36.8 C)] 98.3 F (36.8 C) (01/05 0300) Pulse Rate:  [57-141] 65 (01/05 0300) Resp:  [7-18] 18 (01/05 0300) BP: (97-145)/(51-83) 101/53 (01/05 0300) SpO2:  [94 %-100 %] 94 % (01/05 0300) Weight:  [80.7 kg (178 lb)] 80.7 kg (178 lb) (01/04 1208)  Intake/Output from previous day: 01/04 0701 - 01/05 0700 In: 1297.4 [P.O.:240; I.V.:857.4; IV Piggyback:200] Out: 280 [Urine:265; Blood:15] Intake/Output this shift: Total I/O In: 240 [P.O.:240] Out: -   Physical Exam: Full strength both legs, some right thigh discomfort without numbness or weakness.  Dressings CDI.  Lab Results: No results for input(s): WBC, HGB, HCT, PLT in the last 72 hours. BMET No results for input(s): NA, K, CL, CO2, GLUCOSE, BUN, CREATININE, CALCIUM in the last 72 hours.  Studies/Results: Dg Lumbar Spine 2-3 Views  Result Date: 02/11/2017 CLINICAL DATA:  Status post surgical fusion of L3-4. EXAM: LUMBAR SPINE - 2-3 VIEW; DG C-ARM 61-120 MIN FLUOROSCOPY TIME:  1 minutes 20 seconds. COMPARISON:  MRI of September 14, 2016. FINDINGS: Two intraoperative fluoroscopic images were submitted for review. Status post right lateral fusion of L3-4 with interbody spacer. Good alignment of vertebral bodies is noted. IMPRESSION: Status post lateral fusion of L3-4. Electronically Signed   By: Marijo Conception, M.D.   On: 02/11/2017 09:23   Dg C-arm 1-60 Min  Result Date: 02/11/2017 CLINICAL DATA:  Status post surgical fusion of L3-4. EXAM: LUMBAR SPINE - 2-3 VIEW; DG C-ARM 61-120 MIN FLUOROSCOPY TIME:  1 minutes 20 seconds. COMPARISON:  MRI of September 14, 2016. FINDINGS: Two intraoperative fluoroscopic images were submitted for review. Status post right lateral fusion of L3-4 with interbody spacer. Good alignment of vertebral bodies is noted. IMPRESSION: Status post lateral fusion of L3-4. Electronically Signed   By: Marijo Conception, M.D.   On: 02/11/2017 09:23    Assessment/Plan: Doing well.  Discharge home.    LOS: 1 day    Peggyann Shoals, MD 02/12/2017, 6:43 AM

## 2017-02-12 NOTE — Progress Notes (Signed)
Patient is discharged from room 3C10 at this time. Alert and in stable condition. IV site d/c'd and instructions read to patient and spouse with understanding verbalized. Left unit via wheelchair with all belongings at side.  

## 2017-02-12 NOTE — Evaluation (Signed)
Occupational Therapy Evaluation Patient Details Name: Laurie Burch MRN: [id number] DOB: 09/20/1950 Today's Date: 02/12/2017    History of Present Illness Pt is a 67 y/o female s/p L3-4 ALIF. PMH includes HTN, heart murmur, asthma, DM.    Clinical Impression   PTA, pt was living with her husband and was independent. Currently, pt performing ADLs and functional mobility at [employer] level. Provided education and handout on brace management, LB ADLs, toilet transfer, an shower transfer; pt demonstrated understanding. Answered all pt questions. Recommend dc home once medically stable per physician. All acute OT needs met and will sign off. Thank you.     Follow Up Recommendations  No OT follow up;Supervision - Intermittent    Equipment Recommendations  None recommended by OT    Recommendations for Other Services PT consult     Precautions / Restrictions Precautions Precautions: Back Precaution Booklet Issued: Yes (comment) Precaution Comments: Reviewed back precautions with pt.  Required Braces or Orthoses: Spinal Brace Spinal Brace: Lumbar corset;Applied in sitting position Restrictions Weight Bearing Restrictions: No      Mobility Bed Mobility Overal bed mobility: Needs Assistance Bed Mobility: Rolling;Sidelying to Sit;Sit to Sidelying Rolling: Supervision Sidelying to sit: Supervision     Sit to sidelying: Supervision General bed mobility comments: Supervision to ensure log roll technique. Verbal cues for sequencing.   Transfers Overall transfer level: Needs assistance Equipment used: None Transfers: Sit to/from Stand Sit to Stand: Supervision         General transfer comment: supervision for safety.     Balance Overall balance assessment: Needs assistance Sitting-balance support: No upper extremity supported;Feet supported Sitting balance-Leahy Scale: Good     Standing balance support: Single extremity supported;No upper extremity  supported;During functional activity Standing balance-Leahy Scale: Fair Standing balance comment: Able to maintain static standing without UE support.                            ADL either performed or assessed with clinical judgement   ADL Overall ADL's : Needs assistance/impaired Eating/Feeding: Set up;Sitting   Grooming: Set up;Sitting Grooming Details (indicate cue type and reason): Educated pt on compensatory techniques Upper Body Bathing: Set up;Sitting   Lower Body Bathing: Min guard;Sit to/from stand   Upper Body Dressing : Set up;Standing;Supervision/safety Upper Body Dressing Details (indicate cue type and reason): Donned bra, shirt, and brace without difficulty. Educated pt on brace management Lower Body Dressing: Min guard;Sit to/from stand Lower Body Dressing Details (indicate cue type and reason): Pt donned underwear, pantas, socks, and shoes while maintaining back precautions. Pt able to bring ankles to knees for donning shoes and socks. Toilet Transfer: Set up;Supervision/safety;Regular Toilet     Toileting - Clothing Manipulation Details (indicate cue type and reason): Educated pt on toilet hygiene techniques to adhere to precautions   Tub/Shower Transfer Details (indicate cue type and reason): Educated pt on safety in shower and with bathing Functional mobility during ADLs: Supervision/safety General ADL Comments: Pt performing ADLs and fucntional mobility at supervision- [employer] level. Educated pt on compensatory techniques for LB ADLs, toileting, and bathing. Pt dmeonstrated and verbalized understanding.     Vision         Perception     Praxis      Pertinent Vitals/Pain Pain Assessment: Faces Faces Pain Scale: Hurts a little bit Pain Location: Back Pain Descriptors / Indicators: Aching;Operative site guarding Pain Intervention(s): Monitored during session;Repositioned     Hand Dominance Right  Extremity/Trunk Assessment Upper  Extremity Assessment Upper Extremity Assessment: Overall WFL for tasks assessed   Lower Extremity Assessment Lower Extremity Assessment: Defer to PT evaluation RLE Deficits / Details: R hip pain following surgery. Guarded ambulation.  LLE Deficits / Details: Reports LLE deficits had improved.    Cervical / Trunk Assessment Cervical / Trunk Assessment: Other exceptions Cervical / Trunk Exceptions: s/p ALIF    Communication Communication Communication: No difficulties   Cognition Arousal/Alertness: Awake/alert Behavior During Therapy: WFL for tasks assessed/performed Overall Cognitive Status: Within Functional Limits for tasks assessed                                     General Comments  Reviewed back precautions handout    Exercises     Shoulder Instructions      Home Living Family/patient expects to be discharged to:: Private residence Living Arrangements: Spouse/significant other Available Help at Discharge: Family;Available PRN/intermittently Type of Home: House Home Access: Stairs to enter Entrance Stairs-Number of Steps: 1(threshold ) Entrance Stairs-Rails: None Home Layout: One level     Bathroom Shower/Tub: Occupational psychologist: Standard     Home Equipment: Bedside commode          Prior Functioning/Environment Level of Independence: Independent        Comments: Still working 40+ hours a week         OT Problem List: Decreased range of motion;Impaired balance (sitting and/or standing);Decreased knowledge of use of DME or AE;Decreased knowledge of precautions;Pain      OT Treatment/Interventions:      OT Goals(Current goals can be found in the care plan section) Acute Rehab OT Goals Patient Stated Goal: to go home  OT Goal Formulation: With patient Time For Goal Achievement: 02/26/17 Potential to Achieve Goals: Good  OT Frequency:     Barriers to D/C:            Co-evaluation              AM-PAC PT "6  Clicks" Daily Activity     Outcome Measure Help from another person eating meals?: None Help from another person taking care of personal grooming?: None Help from another person toileting, which includes using toliet, bedpan, or urinal?: A Little Help from another person bathing (including washing, rinsing, drying)?: A Little Help from another person to put on and taking off regular upper body clothing?: None Help from another person to put on and taking off regular lower body clothing?: A Little 6 Click Score: 21   End of Session Equipment Utilized During Treatment: Back brace Nurse Communication: Mobility status;Precautions  Activity Tolerance: Patient tolerated treatment well Patient left: in chair;with call bell/phone within reach  OT Visit Diagnosis: Unsteadiness on feet (R26.81);Other abnormalities of gait and mobility (R26.89);Pain Pain - part of body: (Back)                Time: 9798-9211 OT Time Calculation (min): 12 min Charges:  OT General Charges $OT Visit: 1 Visit OT Evaluation $OT Eval Low Complexity: 1 Low G-Codes:     Garnette Greb MSOT, OTR/L Acute Rehab Pager: [phone] Office: [place] 02/12/2017, 9:17 AM

## 2017-02-14 ENCOUNTER — Encounter (HOSPITAL_COMMUNITY): Payer: Self-pay | Admitting: Neurosurgery

## 2017-03-17 ENCOUNTER — Other Ambulatory Visit: Payer: Self-pay

## 2017-03-17 MED ORDER — ESOMEPRAZOLE MAGNESIUM 40 MG PO CPDR: 40.0000 mg | DELAYED_RELEASE_CAPSULE | Freq: Every day | ORAL | 3 refills | Status: DC

## 2017-03-17 NOTE — Telephone Encounter (Signed)
Esomeprazole refilled as requested by [facility] mail order pharmacy. Patient up to date on her office visits.

## 2017-03-21 DIAGNOSIS — G932 Benign intracranial hypertension: Secondary | ICD-10-CM | POA: Diagnosis not present

## 2017-03-21 DIAGNOSIS — Z982 Presence of cerebrospinal fluid drainage device: Secondary | ICD-10-CM | POA: Diagnosis not present

## 2017-03-21 DIAGNOSIS — M4316 Spondylolisthesis, lumbar region: Secondary | ICD-10-CM | POA: Diagnosis not present

## 2017-03-21 DIAGNOSIS — M545 Low back pain: Secondary | ICD-10-CM | POA: Diagnosis not present

## 2017-03-21 DIAGNOSIS — M4126 Other idiopathic scoliosis, lumbar region: Secondary | ICD-10-CM | POA: Diagnosis not present

## 2017-03-21 DIAGNOSIS — M5416 Radiculopathy, lumbar region: Secondary | ICD-10-CM | POA: Diagnosis not present

## 2017-03-21 DIAGNOSIS — M542 Cervicalgia: Secondary | ICD-10-CM | POA: Diagnosis not present

## 2017-03-22 DIAGNOSIS — J209 Acute bronchitis, unspecified: Secondary | ICD-10-CM | POA: Diagnosis not present

## 2017-03-29 ENCOUNTER — Ambulatory Visit
Admission: RE | Admit: 2017-03-29 | Discharge: 2017-03-29 | Disposition: A | Discharge status: Home / Self Care | Payer: BLUE CROSS/BLUE SHIELD | Source: Ambulatory Visit | Attending: Family Medicine | Admitting: Family Medicine

## 2017-03-29 ENCOUNTER — Other Ambulatory Visit: Payer: Self-pay | Admitting: Family Medicine

## 2017-03-29 DIAGNOSIS — J40 Bronchitis, not specified as acute or chronic: Secondary | ICD-10-CM

## 2017-03-29 DIAGNOSIS — J209 Acute bronchitis, unspecified: Secondary | ICD-10-CM | POA: Diagnosis not present

## 2017-03-29 DIAGNOSIS — R05 Cough: Secondary | ICD-10-CM | POA: Diagnosis not present

## 2017-05-02 ENCOUNTER — Ambulatory Visit: Payer: BLUE CROSS/BLUE SHIELD | Admitting: Internal Medicine

## 2017-05-02 DIAGNOSIS — M4316 Spondylolisthesis, lumbar region: Secondary | ICD-10-CM | POA: Diagnosis not present

## 2017-05-02 DIAGNOSIS — M542 Cervicalgia: Secondary | ICD-10-CM | POA: Diagnosis not present

## 2017-05-21 DIAGNOSIS — S61432A Puncture wound without foreign body of left hand, initial encounter: Secondary | ICD-10-CM | POA: Diagnosis not present

## 2017-06-22 ENCOUNTER — Ambulatory Visit (INDEPENDENT_AMBULATORY_CARE_PROVIDER_SITE_OTHER): Payer: BLUE CROSS/BLUE SHIELD | Admitting: Internal Medicine

## 2017-06-22 ENCOUNTER — Encounter: Payer: Self-pay | Admitting: Internal Medicine

## 2017-06-22 VITALS — BP 130/76 | HR 80 | Ht 63.0 in | Wt 179.8 lb

## 2017-06-22 DIAGNOSIS — R1310 Dysphagia, unspecified: Secondary | ICD-10-CM | POA: Diagnosis not present

## 2017-06-22 DIAGNOSIS — Z1211 Encounter for screening for malignant neoplasm of colon: Secondary | ICD-10-CM

## 2017-06-22 DIAGNOSIS — R1319 Other dysphagia: Secondary | ICD-10-CM

## 2017-06-22 NOTE — Patient Instructions (Addendum)
  You have been scheduled for an endoscopy and colonoscopy. Please follow the written instructions given to you at your visit today. Please pick up your prep supplies at the pharmacy. If you use inhalers (even only as needed), please bring them with you on the day of your procedure.  Hold your iron 3 days prior to your procedure.  Hold your diabetic meds the AM of the procedure.   I appreciate the opportunity to care for you. Silvano Rusk, MD, [facility]

## 2017-06-22 NOTE — Progress Notes (Signed)
Laurie Burch 67 y.o. Dec 15, 1950 [id number]  Assessment & Plan:   Encounter Diagnoses  Name Primary?  . Esophageal dysphagia Yes  . Colon cancer screening    Schedule EGD + esophageal dilation and colonoscopy The risks and benefits as well as alternatives of endoscopic procedure(s) have been discussed and reviewed. All questions answered. The patient agrees to proceed.  Continue PPI  I appreciate the opportunity to care for her. NF:[id number], Gwyndolyn Saxon, MD    Subjective:   Chief Complaint: dysphagia and colon cancer screening  HPI The patient is here - seen last year for intermittent solid dysphagia and heartburn. Started on a PPI and scheduled for EGD/dilation and screening colonoscopy. Somehow got another Gianah Batt EOB/bill from [place] and never did her procedures due to that mix up nd sorting that ouy. She is here to schedule again. Dysphagia did not resolve on PPI - still intermittent solid dysphagia.  Also still needs screening colonoscopy. Notes from 05/2016 visit below.   Very nice lady - known from colonoscopy 11 yrs ago - here with heartburn and intermittent sold food dysphagia. c/o also having sore throat at times, especially in AM. Has had 2 C-spine surgeries in past 2 years about. Trying to avoid triggers like spicy food. Says she was asked to stop nexioum in past - ? If not insurance formualry issue. Has some chest tightness with dysphagia. She drinks and food passes. GI ROS o/w negative.     Allergies  Allergen Reactions  . Crestor [Rosuvastatin Calcium] Other (See Comments)    Muscle pain, fatigue  . Irbesartan Other (See Comments)    Myalgias, Body aches  . Lipitor [Atorvastatin] Other (See Comments)    Muscle pain, fatigue  . Lisinopril Cough  . Glucotrol [Glipizide]     UNSPECIFIED REACTION    . Prandin [Repaglinide]     UNSPECIFIED REACTION   . Prednisone     UNSPECIFIED REACTION AT LARGE DOSES  . Simvastatin Other (See Comments)    Muscle  aches, fatigue  . Prilosec [Omeprazole] Rash and Other (See Comments)    INTOLERANCE >  GAS   Current Meds  Medication Sig  . albuterol (PROVENTIL HFA;VENTOLIN HFA) 108 (90 BASE) MCG/ACT inhaler Inhale 2 puffs into the lungs 4 (four) times daily as needed for wheezing or shortness of breath.  Marland Kitchen aspirin EC 81 MG tablet Take 81 mg by mouth daily.  . bisoprolol-hydrochlorothiazide (ZIAC) 2.5-6.25 MG per tablet Take 2 tablets by mouth daily.  . Cinnamon 500 MG TABS Take 500 mg by mouth daily.  . diclofenac (VOLTAREN) 75 MG EC tablet Take 75 mg by mouth daily.  Marland Kitchen esomeprazole (NEXIUM) 40 MG capsule Take 1 capsule (40 mg total) by mouth daily before breakfast.  . gabapentin (NEURONTIN) 300 MG capsule Take 1 capsule (300 mg total) by mouth 3 (three) times daily.  Marland Kitchen glyBURIDE micronized (GLYNASE) 3 MG tablet Take 3-6 mg by mouth See admin instructions. Take 2 tablets in the morning, then 1 tablet in the afternoon  . IRON PO Take 1 tablet by mouth daily. 65mg   . magnesium oxide (MAG-OX) 400 MG tablet Take 400 mg by mouth daily.  . metFORMIN (GLUCOPHAGE-XR) 500 MG 24 hr tablet Take 1,000 mg by mouth 2 (two) times daily.  . Multiple Vitamins-Minerals (MULTIVITAMIN WITH MINERALS) tablet Take 1 tablet by mouth daily.  Marland Kitchen omega-3 acid ethyl esters (LOVAZA) 1 G capsule Take 1 g by mouth daily. MEGA RED  . pravastatin (PRAVACHOL) 80 MG tablet Take 80  mg by mouth at bedtime.   . sitaGLIPtin (JANUVIA) 100 MG tablet Take 100 mg by mouth daily.   Past Medical History:  Diagnosis Date  . Allergic rhinitis   . Anemia   . Arthritis    ankle, neck  . Asthma   . Bronchitis    hx of  . Cataract    bilateral - small cataracts, no tx yet  . 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 Diabetes mellitus without complication ([place])    Type II,  dx 2008  . Gastritis    Takes OTC PRN  . GERD (gastroesophageal reflux disease)    Not taking medications , "its better". OTC med PRN  . Head injury, acute,  without loss of consciousness    years ago  . Headache(784.0)   . Heart murmur    Saw Dr. Marlou Porch 4-5 yrs ago  . Hyperlipemia   . Hypertension   . Lumbar stenosis    Hx  . Meningitis 1993  . Mitral valve prolapse   . OA (osteoarthritis)   . Osteopenia   . Overactive bladder   . PONV (postoperative nausea and vomiting)    Years ago- whe I had shut out in  . Shortness of breath dyspnea    Hx - bronchitis   Past Surgical History:  Procedure Laterality Date  . ABDOMINAL HYSTERECTOMY  1989  . ANKLE SURGERY Right 1978  . ANTERIOR CERVICAL DECOMP/DISCECTOMY FUSION N/A 06/07/2013   Procedure: EXPLORATION OF FUSION AND REMOVAL OF CERVICAL HARDWARE AND ACDF C4-5;  Surgeon: Melina Schools, MD;  Location: [place];  Service: Orthopedics;  Laterality: N/A;  . ANTERIOR CRUCIATE LIGAMENT REPAIR Right 2015  . ANTERIOR LAT LUMBAR FUSION Right 02/11/2017   Procedure: Right Lumbar three-four Anterolateral lumbar interbody fusion with plate;  Surgeon: Erline Levine, MD;  Location: [place];  Service: Neurosurgery;  Laterality: Right;  . APPENDECTOMY  1976  . Arthoscopy Right 1996   right knee  . CERVICAL SPINE SURGERY  2008  . CESAREAN SECTION  1981  . COLONOSCOPY  2006   Kendon Sedeno - internal hemorrhoid  . INSERT INTRAPERITONEAL CATHETER     reinsert-cath  . LUMBAR LAMINECTOMY/DECOMPRESSION MICRODISCECTOMY N/A 08/14/2014   Procedure: LUMBAR DECOMPRESSION L3-5, LEFT SIDED DISCECTOMY L3-4;  Surgeon: Melina Schools, MD;  Location: [place];  Service: Orthopedics;  Laterality: N/A;  . LUMBAR PERITONEAL SHUNT  1984   Pseudo- Tumor- Cerebral  . LYMPH NODE DISSECTION Left   . [place]  . SYNOVECTOMY  1992   Flexox Tendon  . TONSILLECTOMY  1968   and Adnoids  . VEIN LIGATION AND STRIPPING Bilateral 1995  . 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   Review of Systems As above  Objective:   Physical Exam BP 130/76   Pulse 80   Ht 5\' 3"  (1.6 m)   Wt 179 lb 12.8 oz (81.6 kg)   BMI 31.85 kg/m  NAD

## 2017-06-26 ENCOUNTER — Encounter: Payer: Self-pay | Admitting: Internal Medicine

## 2017-06-27 DIAGNOSIS — Z1231 Encounter for screening mammogram for malignant neoplasm of breast: Secondary | ICD-10-CM | POA: Diagnosis not present

## 2017-07-21 DIAGNOSIS — M9903 Segmental and somatic dysfunction of lumbar region: Secondary | ICD-10-CM | POA: Diagnosis not present

## 2017-07-21 DIAGNOSIS — M9901 Segmental and somatic dysfunction of cervical region: Secondary | ICD-10-CM | POA: Diagnosis not present

## 2017-07-21 DIAGNOSIS — M545 Low back pain: Secondary | ICD-10-CM | POA: Diagnosis not present

## 2017-07-21 DIAGNOSIS — M542 Cervicalgia: Secondary | ICD-10-CM | POA: Diagnosis not present

## 2017-08-15 ENCOUNTER — Ambulatory Visit (AMBULATORY_SURGERY_CENTER): Payer: BLUE CROSS/BLUE SHIELD | Admitting: Internal Medicine

## 2017-08-15 ENCOUNTER — Encounter: Payer: Self-pay | Admitting: Internal Medicine

## 2017-08-15 ENCOUNTER — Other Ambulatory Visit: Payer: Self-pay

## 2017-08-15 VITALS — BP 129/78 | HR 57 | Temp 98.9°F | Resp 13 | Ht 63.0 in | Wt 179.0 lb

## 2017-08-15 DIAGNOSIS — R1310 Dysphagia, unspecified: Secondary | ICD-10-CM

## 2017-08-15 DIAGNOSIS — Z1211 Encounter for screening for malignant neoplasm of colon: Secondary | ICD-10-CM | POA: Diagnosis not present

## 2017-08-15 DIAGNOSIS — R1319 Other dysphagia: Secondary | ICD-10-CM

## 2017-08-15 MED ORDER — SODIUM CHLORIDE 0.9 % IV SOLN: 500.0000 mL | Freq: Once | INTRAVENOUS | Status: DC

## 2017-08-15 NOTE — Op Note (Signed)
[facility] Patient Name: Laurie Burch Procedure Date: 08/15/2017 3:51 PM MRN: [id number] Endoscopist: Gatha Mayer , MD Age: 67 Referring MD:  Date of Birth: 1950/05/31 Gender: Female Account #: [id number] Procedure:                Colonoscopy Indications:              Screening for colorectal malignant neoplasm, Last                            colonoscopy: 2006 Medicines:                Propofol per Anesthesia, Monitored Anesthesia Care Procedure:                Pre-Anesthesia Assessment:                           - Prior to the procedure, a History and Physical                            was performed, and patient medications and                            allergies were reviewed. The patient's tolerance of                            previous anesthesia was also reviewed. The risks                            and benefits of the procedure and the sedation                            options and risks were discussed with the patient.                            All questions were answered, and informed consent                            was obtained. Prior Anticoagulants: The patient has                            taken no previous anticoagulant or antiplatelet                            agents. ASA Grade Assessment: II - A patient with                            mild systemic disease. After reviewing the risks                            and benefits, the patient was deemed in                            satisfactory condition to undergo the procedure.  After obtaining informed consent, the colonoscope                            was passed under direct vision. Throughout the                            procedure, the patient's blood pressure, pulse, and                            oxygen saturations were monitored continuously. The                            Colonoscope was introduced through the anus and                            advanced to the the  cecum, identified by                            appendiceal orifice and ileocecal valve. The                            patient tolerated the procedure well. The quality                            of the bowel preparation was good. The colonoscopy                            was somewhat difficult due to significant looping.                            Successful completion of the procedure was aided by                            applying abdominal pressure. The bowel preparation                            used was Miralax. The ileocecal valve, appendiceal                            orifice, and rectum were photographed. Scope In: 4:06:47 PM Scope Out: 4:25:28 PM Scope Withdrawal Time: 0 hours 13 minutes 31 seconds  Total Procedure Duration: 0 hours 18 minutes 41 seconds  Findings:                 The perianal and digital rectal examinations were                            normal.                           Diverticula were found in the sigmoid colon.                           The exam was otherwise without abnormality on  direct and retroflexion views. Complications:            No immediate complications. Estimated Blood Loss:     Estimated blood loss: none. Impression:               - Diverticulosis in the sigmoid colon.                           - The examination was otherwise normal on direct                            and retroflexion views.                           - No specimens collected. Recommendation:           - Patient has a contact number available for                            emergencies. The signs and symptoms of potential                            delayed complications were discussed with the                            patient. Return to normal activities tomorrow.                            Written discharge instructions were provided to the                            patient.                           - Clear liquids x 1 hour then soft foods  rest of                            day. Start prior diet tomorrow.                           - Continue present medications.                           - Repeat colonoscopy in 10 years for screening                            purposes. Gatha Mayer, MD 08/15/2017 4:40:12 PM This report has been signed electronically.

## 2017-08-15 NOTE — Progress Notes (Signed)
Called to room to assist during endoscopic procedure.  Patient ID and intended procedure confirmed with present staff. Received instructions for my participation in the procedure from the performing physician.  

## 2017-08-15 NOTE — Patient Instructions (Addendum)
I dilated the esophagus to see if that helps - no overt narrow areas. Could be related to C-spine surgery.  No polyps or cancer in the colon.  Next routine colonoscopy or other screening test in 10 years - 2029  I appreciate the opportunity to care for you. Gatha Mayer, MD, [facility]  Dilation diet handout given to patient. Diverticulosis handout given to patient.  YOU HAD AN ENDOSCOPIC PROCEDURE TODAY AT [facility]:   Refer to the procedure report that was given to you for any specific questions about what was found during the examination.  If the procedure report does not answer your questions, please call your gastroenterologist to clarify.  If you requested that your care partner not be given the details of your procedure findings, then the procedure report has been included in a sealed envelope for you to review at your convenience later.  YOU SHOULD EXPECT: Some feelings of bloating in the abdomen. Passage of more gas than usual.  Walking can help get rid of the air that was put into your GI tract during the procedure and reduce the bloating. If you had a lower endoscopy (such as a colonoscopy or flexible sigmoidoscopy) you may notice spotting of blood in your stool or on the toilet paper. If you underwent a bowel prep for your procedure, you may not have a normal bowel movement for a few days.  Please Note:  You might notice some irritation and congestion in your nose or some drainage.  This is from the oxygen used during your procedure.  There is no need for concern and it should clear up in a day or so.  SYMPTOMS TO REPORT IMMEDIATELY:   Following lower endoscopy (colonoscopy or flexible sigmoidoscopy):  Excessive amounts of blood in the stool  Significant tenderness or worsening of abdominal pains  Swelling of the abdomen that is new, acute  Fever of 100F or higher   Following upper endoscopy (EGD)  Vomiting of blood or coffee ground material  New  chest pain or pain under the shoulder blades  Painful or persistently difficult swallowing  New shortness of breath  Fever of 100F or higher  Black, tarry-looking stools  For urgent or emergent issues, a gastroenterologist can be reached at any hour by calling [phone].   DIET:  We do recommend a small meal at first, but then you may proceed to your regular diet.  Drink plenty of fluids but you should avoid alcoholic beverages for 24 hours.  ACTIVITY:  You should plan to take it easy for the rest of today and you should NOT DRIVE or use heavy machinery until tomorrow (because of the sedation medicines used during the test).    FOLLOW UP: Our staff will call the number listed on your records the next business day following your procedure to check on you and address any questions or concerns that you may have regarding the information given to you following your procedure. If we do not reach you, we will leave a message.  However, if you are feeling well and you are not experiencing any problems, there is no need to return our call.  We will assume that you have returned to your regular daily activities without incident.  If any biopsies were taken you will be contacted by phone or by letter within the next 1-3 weeks.  Please call us at [phone] if you have not heard about the biopsies in 3 weeks.  SIGNATURES/CONFIDENTIALITY: You and/or your care partner have signed paperwork which will be entered into your electronic medical record.  These signatures attest to the fact that that the information above on your After Visit Summary has been reviewed and is understood.  Full responsibility of the confidentiality of this discharge information lies with you and/or your care-partner.

## 2017-08-15 NOTE — Progress Notes (Signed)
To PACU, VSS. Report to [email or web address] 

## 2017-08-15 NOTE — Op Note (Signed)
[place] Patient Name: Laurie Burch Procedure Date: 08/15/2017 3:51 PM MRN: [id number] Endoscopist: Gatha Mayer , MD Age: 67 Referring MD:  Date of Birth: 07/03/1950 Gender: Female Account #: [id number] Procedure:                Upper GI endoscopy Indications:              Dysphagia Medicines:                Propofol per Anesthesia, Monitored Anesthesia Care Procedure:                Pre-Anesthesia Assessment:                           - Prior to the procedure, a History and Physical                            was performed, and patient medications and                            allergies were reviewed. The patient's tolerance of                            previous anesthesia was also reviewed. The risks                            and benefits of the procedure and the sedation                            options and risks were discussed with the patient.                            All questions were answered, and informed consent                            was obtained. Prior Anticoagulants: The patient has                            taken no previous anticoagulant or antiplatelet                            agents. ASA Grade Assessment: II - A patient with                            mild systemic disease. After reviewing the risks                            and benefits, the patient was deemed in                            satisfactory condition to undergo the procedure.                           After obtaining informed consent, the endoscope was  passed under direct vision. Throughout the                            procedure, the patient's blood pressure, pulse, and                            oxygen saturations were monitored continuously. The                            Endoscope was introduced through the mouth, and                            advanced to the second part of duodenum. The upper                            GI endoscopy was  accomplished without difficulty.                            The patient tolerated the procedure well. Scope In: Scope Out: Findings:                 The examined esophagus was mildly tortuous. The                            scope was withdrawn. Dilation was performed with a                            Maloney dilator with mild resistance at 76 Fr.                            Estimated blood loss: none.                           The cardia and gastric fundus were normal on                            retroflexion.                           Localized mildly erythematous mucosa without                            bleeding was found in the prepyloric region of the                            stomach.                           The exam was otherwise without abnormality. Complications:            No immediate complications. Estimated Blood Loss:     Estimated blood loss: none. Impression:               - Tortuous esophagus.                           - Erythematous  mucosa in the prepyloric region of                            the stomach.                           - The examination was otherwise normal.                           - No specimens collected. Recommendation:           - Patient has a contact number available for                            emergencies. The signs and symptoms of potential                            delayed complications were discussed with the                            patient. Return to normal activities tomorrow.                            Written discharge instructions were provided to the                            patient.                           - Clear liquids x 1 hour then soft foods rest of                            day. Start prior diet tomorrow.                           - Continue present medications.                           - See the other procedure note for documentation of                            additional recommendations. Gatha Mayer,  MD 08/15/2017 4:37:20 PM This report has been signed electronically.

## 2017-08-16 ENCOUNTER — Telehealth: Payer: Self-pay

## 2017-08-16 NOTE — Telephone Encounter (Signed)
  Follow up Call-  Call back number 08/15/2017  Post procedure Call Back phone  # [phone] cell  Permission to leave phone message Yes  Some recent data might be hidden     Patient questions:  Do you have a fever, pain , or abdominal swelling? No. Pain Score  0 *  Have you tolerated food without any problems? Yes.    Have you been able to return to your normal activities? Yes.    Do you have any questions about your discharge instructions: Diet   No. Medications  No. Follow up visit  No.  Do you have questions or concerns about your Care? No.  Actions: * If pain score is 4 or above: No action needed, pain <4.

## 2017-08-18 DIAGNOSIS — E782 Mixed hyperlipidemia: Secondary | ICD-10-CM | POA: Diagnosis not present

## 2017-08-18 DIAGNOSIS — G8929 Other chronic pain: Secondary | ICD-10-CM | POA: Diagnosis not present

## 2017-08-18 DIAGNOSIS — M545 Low back pain: Secondary | ICD-10-CM | POA: Diagnosis not present

## 2017-08-18 DIAGNOSIS — E119 Type 2 diabetes mellitus without complications: Secondary | ICD-10-CM | POA: Diagnosis not present

## 2017-08-18 DIAGNOSIS — I10 Essential (primary) hypertension: Secondary | ICD-10-CM | POA: Diagnosis not present

## 2017-10-13 DIAGNOSIS — M25571 Pain in right ankle and joints of right foot: Secondary | ICD-10-CM | POA: Diagnosis not present

## 2017-10-13 DIAGNOSIS — M19071 Primary osteoarthritis, right ankle and foot: Secondary | ICD-10-CM | POA: Diagnosis not present

## 2017-10-13 DIAGNOSIS — M12571 Traumatic arthropathy, right ankle and foot: Secondary | ICD-10-CM | POA: Diagnosis not present

## 2017-11-02 DIAGNOSIS — M19071 Primary osteoarthritis, right ankle and foot: Secondary | ICD-10-CM | POA: Diagnosis not present

## 2017-11-02 DIAGNOSIS — M19171 Post-traumatic osteoarthritis, right ankle and foot: Secondary | ICD-10-CM | POA: Diagnosis not present

## 2017-11-14 DIAGNOSIS — G932 Benign intracranial hypertension: Secondary | ICD-10-CM | POA: Diagnosis not present

## 2017-11-14 DIAGNOSIS — M5416 Radiculopathy, lumbar region: Secondary | ICD-10-CM | POA: Diagnosis not present

## 2017-11-14 DIAGNOSIS — Z982 Presence of cerebrospinal fluid drainage device: Secondary | ICD-10-CM | POA: Diagnosis not present

## 2017-11-14 DIAGNOSIS — M4126 Other idiopathic scoliosis, lumbar region: Secondary | ICD-10-CM | POA: Diagnosis not present

## 2017-11-14 DIAGNOSIS — M545 Low back pain: Secondary | ICD-10-CM | POA: Diagnosis not present

## 2018-01-27 DIAGNOSIS — E119 Type 2 diabetes mellitus without complications: Secondary | ICD-10-CM | POA: Diagnosis not present

## 2018-03-01 ENCOUNTER — Other Ambulatory Visit: Payer: Self-pay | Admitting: Internal Medicine

## 2018-03-03 DIAGNOSIS — Z7984 Long term (current) use of oral hypoglycemic drugs: Secondary | ICD-10-CM | POA: Diagnosis not present

## 2018-03-03 DIAGNOSIS — M545 Low back pain: Secondary | ICD-10-CM | POA: Diagnosis not present

## 2018-03-03 DIAGNOSIS — K219 Gastro-esophageal reflux disease without esophagitis: Secondary | ICD-10-CM | POA: Diagnosis not present

## 2018-03-03 DIAGNOSIS — E119 Type 2 diabetes mellitus without complications: Secondary | ICD-10-CM | POA: Diagnosis not present

## 2018-03-03 DIAGNOSIS — I10 Essential (primary) hypertension: Secondary | ICD-10-CM | POA: Diagnosis not present

## 2018-03-03 DIAGNOSIS — E782 Mixed hyperlipidemia: Secondary | ICD-10-CM | POA: Diagnosis not present

## 2018-03-03 DIAGNOSIS — M150 Primary generalized (osteo)arthritis: Secondary | ICD-10-CM | POA: Diagnosis not present

## 2018-03-03 DIAGNOSIS — N183 Chronic kidney disease, stage 3 (moderate): Secondary | ICD-10-CM | POA: Diagnosis not present

## 2018-08-29 DIAGNOSIS — Z1231 Encounter for screening mammogram for malignant neoplasm of breast: Secondary | ICD-10-CM | POA: Diagnosis not present

## 2018-09-06 DIAGNOSIS — K219 Gastro-esophageal reflux disease without esophagitis: Secondary | ICD-10-CM | POA: Diagnosis not present

## 2018-09-06 DIAGNOSIS — N183 Chronic kidney disease, stage 3 (moderate): Secondary | ICD-10-CM | POA: Diagnosis not present

## 2018-09-06 DIAGNOSIS — M150 Primary generalized (osteo)arthritis: Secondary | ICD-10-CM | POA: Diagnosis not present

## 2018-09-06 DIAGNOSIS — E1169 Type 2 diabetes mellitus with other specified complication: Secondary | ICD-10-CM | POA: Diagnosis not present

## 2018-09-07 DIAGNOSIS — E1169 Type 2 diabetes mellitus with other specified complication: Secondary | ICD-10-CM | POA: Diagnosis not present

## 2018-09-07 DIAGNOSIS — I10 Essential (primary) hypertension: Secondary | ICD-10-CM | POA: Diagnosis not present

## 2018-09-07 DIAGNOSIS — E782 Mixed hyperlipidemia: Secondary | ICD-10-CM | POA: Diagnosis not present

## 2018-09-22 DIAGNOSIS — M13841 Other specified arthritis, right hand: Secondary | ICD-10-CM | POA: Diagnosis not present

## 2018-09-22 DIAGNOSIS — M79644 Pain in right finger(s): Secondary | ICD-10-CM | POA: Diagnosis not present

## 2018-09-22 DIAGNOSIS — M79645 Pain in left finger(s): Secondary | ICD-10-CM | POA: Diagnosis not present

## 2018-09-22 DIAGNOSIS — M13842 Other specified arthritis, left hand: Secondary | ICD-10-CM | POA: Diagnosis not present

## 2018-12-15 DIAGNOSIS — M12571 Traumatic arthropathy, right ankle and foot: Secondary | ICD-10-CM | POA: Diagnosis not present

## 2018-12-15 DIAGNOSIS — M25571 Pain in right ankle and joints of right foot: Secondary | ICD-10-CM | POA: Diagnosis not present

## 2018-12-20 ENCOUNTER — Other Ambulatory Visit (HOSPITAL_COMMUNITY): Payer: Self-pay | Admitting: Orthopedic Surgery

## 2018-12-20 ENCOUNTER — Other Ambulatory Visit: Payer: Self-pay | Admitting: Student

## 2018-12-20 DIAGNOSIS — M25571 Pain in right ankle and joints of right foot: Secondary | ICD-10-CM

## 2019-01-11 ENCOUNTER — Ambulatory Visit
Admission: RE | Admit: 2019-01-11 | Discharge: 2019-01-11 | Disposition: A | Discharge status: Home / Self Care | Payer: BC Managed Care – PPO | Source: Ambulatory Visit | Attending: Student | Admitting: Student

## 2019-01-11 DIAGNOSIS — M19071 Primary osteoarthritis, right ankle and foot: Secondary | ICD-10-CM | POA: Diagnosis not present

## 2019-01-11 DIAGNOSIS — M19171 Post-traumatic osteoarthritis, right ankle and foot: Secondary | ICD-10-CM | POA: Diagnosis not present

## 2019-01-11 DIAGNOSIS — M25571 Pain in right ankle and joints of right foot: Secondary | ICD-10-CM

## 2019-01-11 DIAGNOSIS — M19179 Post-traumatic osteoarthritis, unspecified ankle and foot: Secondary | ICD-10-CM | POA: Diagnosis not present

## 2019-01-30 DIAGNOSIS — E119 Type 2 diabetes mellitus without complications: Secondary | ICD-10-CM | POA: Diagnosis not present

## 2019-02-28 ENCOUNTER — Other Ambulatory Visit: Payer: Self-pay

## 2019-02-28 ENCOUNTER — Encounter (HOSPITAL_BASED_OUTPATIENT_CLINIC_OR_DEPARTMENT_OTHER): Payer: Self-pay | Admitting: Orthopedic Surgery

## 2019-03-05 ENCOUNTER — Other Ambulatory Visit (HOSPITAL_COMMUNITY)
Admission: RE | Admit: 2019-03-05 | Discharge: 2019-03-05 | Disposition: A | Discharge status: Home / Self Care | Payer: 59 | Source: Ambulatory Visit | Attending: Orthopedic Surgery | Admitting: Orthopedic Surgery

## 2019-03-05 ENCOUNTER — Other Ambulatory Visit: Payer: Self-pay

## 2019-03-05 ENCOUNTER — Encounter (HOSPITAL_BASED_OUTPATIENT_CLINIC_OR_DEPARTMENT_OTHER)
Admission: RE | Admit: 2019-03-05 | Discharge: 2019-03-05 | Disposition: A | Discharge status: Home / Self Care | Payer: 59 | Source: Ambulatory Visit | Attending: Orthopedic Surgery | Admitting: Orthopedic Surgery

## 2019-03-05 DIAGNOSIS — I10 Essential (primary) hypertension: Secondary | ICD-10-CM | POA: Diagnosis not present

## 2019-03-05 DIAGNOSIS — Z01812 Encounter for preprocedural laboratory examination: Secondary | ICD-10-CM | POA: Insufficient documentation

## 2019-03-05 DIAGNOSIS — E782 Mixed hyperlipidemia: Secondary | ICD-10-CM | POA: Diagnosis not present

## 2019-03-05 DIAGNOSIS — Z20822 Contact with and (suspected) exposure to covid-19: Secondary | ICD-10-CM | POA: Diagnosis not present

## 2019-03-05 DIAGNOSIS — E1169 Type 2 diabetes mellitus with other specified complication: Secondary | ICD-10-CM | POA: Diagnosis not present

## 2019-03-05 DIAGNOSIS — K219 Gastro-esophageal reflux disease without esophagitis: Secondary | ICD-10-CM | POA: Diagnosis not present

## 2019-03-05 DIAGNOSIS — M19171 Post-traumatic osteoarthritis, right ankle and foot: Secondary | ICD-10-CM | POA: Diagnosis not present

## 2019-03-05 LAB — SURGICAL PCR SCREEN
MRSA, PCR: NEGATIVE
Staphylococcus aureus: NEGATIVE

## 2019-03-05 LAB — BASIC METABOLIC PANEL
Anion gap: 13 (ref 5–15)
BUN: 11 mg/dL (ref 8–23)
CO2: 27 mmol/L (ref 22–32)
Calcium: 9.3 mg/dL (ref 8.9–10.3)
Chloride: 100 mmol/L (ref 98–111)
Creatinine, Ser: 0.88 mg/dL (ref 0.44–1.00)
GFR calc Af Amer: >60 mL/min (ref >60)
GFR calc non Af Amer: >60 mL/min (ref >60)
Glucose, Bld: 124 mg/dL — ABNORMAL HIGH (ref 70–99)
Potassium: 3.9 mmol/L (ref 3.5–5.1)
Sodium: 140 mmol/L (ref 135–145)

## 2019-03-05 LAB — SARS CORONAVIRUS 2 (TAT 6-24 HRS): SARS Coronavirus 2: NEGATIVE

## 2019-03-05 NOTE — Progress Notes (Signed)

## 2019-03-07 NOTE — Anesthesia Preprocedure Evaluation (Addendum)
Anesthesia Evaluation  Patient identified by MRN, date of birth, ID band Patient awake    Reviewed: Allergy & Precautions, NPO status , Patient's Chart, lab work & pertinent test results  History of Anesthesia Complications (+) PONV  Airway Mallampati: II  TM Distance: >3 FB Neck ROM: Full    Dental no notable dental hx. (+) Teeth Intact, Dental Advisory Given   Pulmonary asthma ,    Pulmonary exam normal breath sounds clear to auscultation       Cardiovascular hypertension, Pt. on medications and Pt. on home beta blockers Normal cardiovascular exam Rhythm:Regular Rate:Normal  EKG 03-05-19 NSR   Neuro/Psych    GI/Hepatic   Endo/Other  diabetes  Renal/GU K+ 3.9 Cr 0.88     Musculoskeletal   Abdominal   Peds  Hematology   Anesthesia Other Findings   Reproductive/Obstetrics                            Anesthesia Physical Anesthesia Plan  ASA: III  Anesthesia Plan: General   Post-op Pain Management:  Regional for Post-op pain   Induction:   PONV Risk Score and Plan: 3 and Treatment may vary due to age or medical condition, Midazolam, Dexamethasone and Ondansetron  Airway Management Planned: LMA  Additional Equipment: None  Intra-op Plan:   Post-operative Plan:   Informed Consent: I have reviewed the patients History and Physical, chart, labs and discussed the procedure including the risks, benefits and alternatives for the proposed anesthesia with the patient or authorized representative who has indicated his/her understanding and acceptance.     Dental advisory given  Plan Discussed with:   Anesthesia Plan Comments: (LMA w R Popliteal block + R adductor canal block )       Anesthesia Quick Evaluation

## 2019-03-08 ENCOUNTER — Encounter (HOSPITAL_BASED_OUTPATIENT_CLINIC_OR_DEPARTMENT_OTHER): Payer: Self-pay | Admitting: Orthopedic Surgery

## 2019-03-08 ENCOUNTER — Ambulatory Visit (HOSPITAL_COMMUNITY): Payer: No Typology Code available for payment source

## 2019-03-08 ENCOUNTER — Encounter (HOSPITAL_BASED_OUTPATIENT_CLINIC_OR_DEPARTMENT_OTHER)
Admission: RE | Disposition: A | Discharge status: Home / Self Care | Payer: Self-pay | Source: Home / Self Care | Attending: Orthopedic Surgery

## 2019-03-08 ENCOUNTER — Ambulatory Visit (HOSPITAL_BASED_OUTPATIENT_CLINIC_OR_DEPARTMENT_OTHER): Payer: No Typology Code available for payment source | Admitting: Anesthesiology

## 2019-03-08 ENCOUNTER — Ambulatory Visit (HOSPITAL_COMMUNITY)
Admission: RE | Admit: 2019-03-08 | Discharge: 2019-03-09 | Disposition: A | Discharge status: Home / Self Care | Payer: No Typology Code available for payment source | Attending: Orthopedic Surgery | Admitting: Orthopedic Surgery

## 2019-03-08 ENCOUNTER — Other Ambulatory Visit: Payer: Self-pay

## 2019-03-08 DIAGNOSIS — Z7984 Long term (current) use of oral hypoglycemic drugs: Secondary | ICD-10-CM | POA: Insufficient documentation

## 2019-03-08 DIAGNOSIS — M19171 Post-traumatic osteoarthritis, right ankle and foot: Secondary | ICD-10-CM | POA: Diagnosis not present

## 2019-03-08 DIAGNOSIS — Z79899 Other long term (current) drug therapy: Secondary | ICD-10-CM | POA: Insufficient documentation

## 2019-03-08 DIAGNOSIS — M12571 Traumatic arthropathy, right ankle and foot: Secondary | ICD-10-CM | POA: Diagnosis not present

## 2019-03-08 DIAGNOSIS — K219 Gastro-esophageal reflux disease without esophagitis: Secondary | ICD-10-CM | POA: Insufficient documentation

## 2019-03-08 DIAGNOSIS — M6701 Short Achilles tendon (acquired), right ankle: Secondary | ICD-10-CM | POA: Diagnosis not present

## 2019-03-08 DIAGNOSIS — M19071 Primary osteoarthritis, right ankle and foot: Secondary | ICD-10-CM

## 2019-03-08 DIAGNOSIS — E785 Hyperlipidemia, unspecified: Secondary | ICD-10-CM | POA: Diagnosis not present

## 2019-03-08 DIAGNOSIS — I10 Essential (primary) hypertension: Secondary | ICD-10-CM | POA: Diagnosis not present

## 2019-03-08 DIAGNOSIS — Z981 Arthrodesis status: Secondary | ICD-10-CM | POA: Diagnosis not present

## 2019-03-08 DIAGNOSIS — E119 Type 2 diabetes mellitus without complications: Secondary | ICD-10-CM | POA: Insufficient documentation

## 2019-03-08 DIAGNOSIS — G8918 Other acute postprocedural pain: Secondary | ICD-10-CM | POA: Diagnosis not present

## 2019-03-08 DIAGNOSIS — Z7982 Long term (current) use of aspirin: Secondary | ICD-10-CM | POA: Diagnosis not present

## 2019-03-08 DIAGNOSIS — Z96661 Presence of right artificial ankle joint: Secondary | ICD-10-CM | POA: Diagnosis not present

## 2019-03-08 DIAGNOSIS — Z471 Aftercare following joint replacement surgery: Secondary | ICD-10-CM | POA: Diagnosis not present

## 2019-03-08 HISTORY — PX: FOOT ARTHRODESIS: SHX1655

## 2019-03-08 HISTORY — PX: ACHILLES TENDON LENGTHENING: SHX6455

## 2019-03-08 HISTORY — PX: TOTAL ANKLE ARTHROPLASTY: SHX811

## 2019-03-08 LAB — GLUCOSE, CAPILLARY
Glucose-Capillary: 89 mg/dL (ref 70–99)
Glucose-Capillary: 143 mg/dL — ABNORMAL HIGH (ref 70–99)
Glucose-Capillary: 177 mg/dL — ABNORMAL HIGH (ref 70–99)
Glucose-Capillary: 147 mg/dL — ABNORMAL HIGH (ref 70–99)

## 2019-03-08 SURGERY — ARTHROPLASTY, ANKLE, TOTAL
Anesthesia: General | Site: Foot | Laterality: Right

## 2019-03-08 MED ORDER — HYDROMORPHONE HCL 1 MG/ML IJ SOLN: 0.5000 mg | INTRAMUSCULAR | Status: DC | PRN

## 2019-03-08 MED ORDER — OXYCODONE HCL 5 MG PO TABS
5.0000 mg | ORAL_TABLET | ORAL | Status: DC | PRN
  Administered 2019-03-08: 5 mg via ORAL
  Filled 2019-03-08: qty 1

## 2019-03-08 MED ORDER — CEFAZOLIN SODIUM-DEXTROSE 2-4 GM/100ML-% IV SOLN
INTRAVENOUS | Status: AC
  Filled 2019-03-08: qty 100

## 2019-03-08 MED ORDER — METFORMIN HCL ER 500 MG PO TB24
1000.0000 mg | ORAL_TABLET | Freq: Two times a day (BID) | ORAL | Status: DC
  Administered 2019-03-08: 1000 mg via ORAL

## 2019-03-08 MED ORDER — ONDANSETRON HCL 4 MG/2ML IJ SOLN: 4.0000 mg | Freq: Four times a day (QID) | INTRAMUSCULAR | Status: DC | PRN

## 2019-03-08 MED ORDER — FENTANYL CITRATE (PF) 100 MCG/2ML IJ SOLN
INTRAMUSCULAR | Status: AC
  Filled 2019-03-08: qty 2

## 2019-03-08 MED ORDER — OXYCODONE HCL 5 MG PO TABS: 5.0000 mg | ORAL_TABLET | ORAL | 0 refills | Status: AC | PRN

## 2019-03-08 MED ORDER — MAGNESIUM OXIDE 400 MG PO TABS: 400.0000 mg | ORAL_TABLET | Freq: Every day | ORAL | Status: DC

## 2019-03-08 MED ORDER — CLONIDINE HCL (ANALGESIA) 100 MCG/ML EP SOLN
EPIDURAL | Status: DC | PRN
  Administered 2019-03-08 (×2): 100 ug

## 2019-03-08 MED ORDER — CEFAZOLIN SODIUM-DEXTROSE 2-4 GM/100ML-% IV SOLN
2.0000 g | INTRAVENOUS | Status: AC
  Administered 2019-03-08: 2 g via INTRAVENOUS

## 2019-03-08 MED ORDER — MIDAZOLAM HCL 2 MG/2ML IJ SOLN
INTRAMUSCULAR | Status: AC
  Filled 2019-03-08: qty 2

## 2019-03-08 MED ORDER — PANTOPRAZOLE SODIUM 40 MG PO TBEC: 40.0000 mg | DELAYED_RELEASE_TABLET | Freq: Every day | ORAL | Status: DC

## 2019-03-08 MED ORDER — ONDANSETRON HCL 4 MG PO TABS: 4.0000 mg | ORAL_TABLET | Freq: Four times a day (QID) | ORAL | Status: DC | PRN

## 2019-03-08 MED ORDER — GABAPENTIN 300 MG PO CAPS
300.0000 mg | ORAL_CAPSULE | Freq: Three times a day (TID) | ORAL | Status: DC
  Administered 2019-03-08 (×2): 300 mg via ORAL

## 2019-03-08 MED ORDER — PRAVASTATIN SODIUM 80 MG PO TABS: 80.0000 mg | ORAL_TABLET | Freq: Every day | ORAL | Status: DC

## 2019-03-08 MED ORDER — DEXAMETHASONE SODIUM PHOSPHATE 10 MG/ML IJ SOLN
INTRAMUSCULAR | Status: DC | PRN
  Administered 2019-03-08: 4 mg via INTRAVENOUS

## 2019-03-08 MED ORDER — ROPIVACAINE HCL 5 MG/ML IJ SOLN
INTRAMUSCULAR | Status: DC | PRN
  Administered 2019-03-08: 30 mL via PERINEURAL

## 2019-03-08 MED ORDER — METHOCARBAMOL 1000 MG/10ML IJ SOLN: 500.0000 mg | Freq: Four times a day (QID) | INTRAVENOUS | Status: DC | PRN

## 2019-03-08 MED ORDER — VANCOMYCIN HCL 500 MG IV SOLR
INTRAVENOUS | Status: DC | PRN
  Administered 2019-03-08: 500 mg via TOPICAL

## 2019-03-08 MED ORDER — GLYBURIDE MICRONIZED 3 MG PO TABS
3.0000 mg | ORAL_TABLET | ORAL | Status: DC
  Administered 2019-03-08: 6 mg via ORAL

## 2019-03-08 MED ORDER — ONDANSETRON HCL 4 MG/2ML IJ SOLN
INTRAMUSCULAR | Status: DC | PRN
  Administered 2019-03-08: 4 mg via INTRAVENOUS

## 2019-03-08 MED ORDER — ACETAMINOPHEN 325 MG PO TABS: 325.0000 mg | ORAL_TABLET | Freq: Four times a day (QID) | ORAL | Status: DC | PRN

## 2019-03-08 MED ORDER — LIDOCAINE HCL (CARDIAC) PF 100 MG/5ML IV SOSY
PREFILLED_SYRINGE | INTRAVENOUS | Status: DC | PRN
  Administered 2019-03-08: 30 mg via INTRAVENOUS

## 2019-03-08 MED ORDER — ACETAMINOPHEN 10 MG/ML IV SOLN
INTRAVENOUS | Status: AC
  Filled 2019-03-08: qty 100

## 2019-03-08 MED ORDER — DOCUSATE SODIUM 100 MG PO CAPS: 100.0000 mg | ORAL_CAPSULE | Freq: Every day | ORAL | 2 refills | Status: AC | PRN

## 2019-03-08 MED ORDER — ONDANSETRON HCL 4 MG/2ML IJ SOLN: 4.0000 mg | Freq: Once | INTRAMUSCULAR | Status: DC | PRN

## 2019-03-08 MED ORDER — SODIUM CHLORIDE 0.9 % IV SOLN: INTRAVENOUS | Status: DC

## 2019-03-08 MED ORDER — ASPIRIN EC 81 MG PO TBEC: 81.0000 mg | DELAYED_RELEASE_TABLET | Freq: Two times a day (BID) | ORAL | 0 refills | Status: AC

## 2019-03-08 MED ORDER — GABAPENTIN 300 MG PO CAPS
ORAL_CAPSULE | ORAL | Status: AC
  Filled 2019-03-08: qty 1

## 2019-03-08 MED ORDER — LINAGLIPTIN 5 MG PO TABS: 5.0000 mg | ORAL_TABLET | Freq: Every day | ORAL | Status: DC

## 2019-03-08 MED ORDER — ROPIVACAINE HCL 7.5 MG/ML IJ SOLN
INTRAMUSCULAR | Status: DC | PRN
  Administered 2019-03-08: 20 mL via PERINEURAL

## 2019-03-08 MED ORDER — METOCLOPRAMIDE HCL 5 MG PO TABS: 5.0000 mg | ORAL_TABLET | Freq: Three times a day (TID) | ORAL | Status: DC | PRN

## 2019-03-08 MED ORDER — PROPOFOL 500 MG/50ML IV EMUL
INTRAVENOUS | Status: DC | PRN
  Administered 2019-03-08: 25 ug/kg/min via INTRAVENOUS

## 2019-03-08 MED ORDER — ALBUTEROL SULFATE HFA 108 (90 BASE) MCG/ACT IN AERS: 2.0000 | INHALATION_SPRAY | Freq: Four times a day (QID) | RESPIRATORY_TRACT | Status: DC | PRN

## 2019-03-08 MED ORDER — LACTATED RINGERS IV SOLN: INTRAVENOUS | Status: DC

## 2019-03-08 MED ORDER — INSULIN ASPART 100 UNIT/ML ~~LOC~~ SOLN
0.0000 [IU] | Freq: Three times a day (TID) | SUBCUTANEOUS | Status: DC
  Administered 2019-03-08: 4 [IU] via SUBCUTANEOUS
  Filled 2019-03-08: qty 1

## 2019-03-08 MED ORDER — SENNA 8.6 MG PO TABS: 2.0000 | ORAL_TABLET | Freq: Two times a day (BID) | ORAL | 0 refills | Status: AC

## 2019-03-08 MED ORDER — METHOCARBAMOL 500 MG PO TABS
500.0000 mg | ORAL_TABLET | Freq: Four times a day (QID) | ORAL | Status: DC | PRN
  Administered 2019-03-08: 500 mg via ORAL
  Filled 2019-03-08: qty 1

## 2019-03-08 MED ORDER — OXYCODONE HCL 5 MG PO TABS: 10.0000 mg | ORAL_TABLET | ORAL | Status: DC | PRN

## 2019-03-08 MED ORDER — 0.9 % SODIUM CHLORIDE (POUR BTL) OPTIME
TOPICAL | Status: DC | PRN
  Administered 2019-03-08: 09:00:00 200 mL

## 2019-03-08 MED ORDER — MIDAZOLAM HCL 2 MG/2ML IJ SOLN
1.0000 mg | INTRAMUSCULAR | Status: DC | PRN
  Administered 2019-03-08: 1 mg via INTRAVENOUS

## 2019-03-08 MED ORDER — CHLORHEXIDINE GLUCONATE 4 % EX LIQD: 60.0000 mL | Freq: Once | CUTANEOUS | Status: DC

## 2019-03-08 MED ORDER — FENTANYL CITRATE (PF) 100 MCG/2ML IJ SOLN
50.0000 ug | INTRAMUSCULAR | Status: DC | PRN
  Administered 2019-03-08: 50 ug via INTRAVENOUS

## 2019-03-08 MED ORDER — BISOPROLOL-HYDROCHLOROTHIAZIDE 2.5-6.25 MG PO TABS
2.0000 | ORAL_TABLET | Freq: Every day | ORAL | Status: DC
  Administered 2019-03-08: 2 via ORAL

## 2019-03-08 MED ORDER — METOCLOPRAMIDE HCL 5 MG/ML IJ SOLN: 5.0000 mg | Freq: Three times a day (TID) | INTRAMUSCULAR | Status: DC | PRN

## 2019-03-08 MED ORDER — FENTANYL CITRATE (PF) 100 MCG/2ML IJ SOLN: 25.0000 ug | INTRAMUSCULAR | Status: DC | PRN

## 2019-03-08 MED ORDER — PROPOFOL 10 MG/ML IV BOLUS
INTRAVENOUS | Status: DC | PRN
  Administered 2019-03-08: 100 mg via INTRAVENOUS

## 2019-03-08 MED ORDER — ACETAMINOPHEN 10 MG/ML IV SOLN
1000.0000 mg | Freq: Once | INTRAVENOUS | Status: DC | PRN
  Administered 2019-03-08: 1000 mg via INTRAVENOUS

## 2019-03-08 SURGICAL SUPPLY — 106 items
BANDAGE ESMARK 6X9 LF (GAUZE/BANDAGES/DRESSINGS) IMPLANT
BIT DRILL 5/64X5 DISP (BIT) ×4 IMPLANT
BIT DRILL CANN 3.5MM (DRILL) ×2 IMPLANT
BIT TREPHINE CORING 8 (BIT) IMPLANT
BIT TREPHINE CORING 8MM (BIT)
BLADE AVERAGE 25MMX9MM (BLADE)
BLADE AVERAGE 25X9 (BLADE) IMPLANT
BLADE MICRO SAGITTAL (BLADE) IMPLANT
BLADE OSC (BLADE) IMPLANT
BLADE RECIP (BLADE) IMPLANT
BLADE RECIPRO TAPERED (BLADE) ×4 IMPLANT
BLADE SAW 8X12 (BLADE) ×1
BLADE SAW OSC ANKLE 8X63X1.19 (BLADE) ×3 IMPLANT
BLADE SURG 15 STRL LF DISP TIS (BLADE) ×10 IMPLANT
BLADE SURG 15 STRL SS (BLADE) ×10
BNDG COHESIVE 4X5 TAN STRL (GAUZE/BANDAGES/DRESSINGS) ×4 IMPLANT
BNDG COHESIVE 6X5 TAN STRL LF (GAUZE/BANDAGES/DRESSINGS) IMPLANT
BNDG ELASTIC 4X5.8 VLCR STR LF (GAUZE/BANDAGES/DRESSINGS) IMPLANT
BNDG ELASTIC 6X5.8 VLCR STR LF (GAUZE/BANDAGES/DRESSINGS) IMPLANT
BNDG ESMARK 6X9 LF (GAUZE/BANDAGES/DRESSINGS)
CHLORAPREP W/TINT 26 (MISCELLANEOUS) ×4 IMPLANT
CLIP LOCKING VANTAGE SZ 2 (Clip) ×4 IMPLANT
COMP TALAR VT SZ 2 RT (Ankle) ×4 IMPLANT
COMPONENT TALAR VT SZ 2 RT (Ankle) ×2 IMPLANT
COVER BACK TABLE 60X90IN (DRAPES) ×8 IMPLANT
COVER MAYO STAND STRL (DRAPES) ×4 IMPLANT
COVER WAND RF STERILE (DRAPES) IMPLANT
CUFF TOURN SGL QUICK 34 (TOURNIQUET CUFF) ×2
CUFF TRNQT CYL 34X4.125X (TOURNIQUET CUFF) ×2 IMPLANT
DECANTER SPIKE VIAL GLASS SM (MISCELLANEOUS) IMPLANT
DRAPE C-ARM 42X72 X-RAY (DRAPES) ×4 IMPLANT
DRAPE C-ARMOR (DRAPES) ×4 IMPLANT
DRAPE EXTREMITY T 121X128X90 (DISPOSABLE) ×4 IMPLANT
DRAPE IMP U-DRAPE 54X76 (DRAPES) ×4 IMPLANT
DRAPE OEC MINIVIEW 54X84 (DRAPES) IMPLANT
DRAPE U-SHAPE 47X51 STRL (DRAPES) ×4 IMPLANT
DRILL CANN 3.5MM (DRILL) ×4
DRSG MEPILEX BORDER 4X4 (GAUZE/BANDAGES/DRESSINGS) ×4 IMPLANT
DRSG MEPILEX BORDER 4X8 (GAUZE/BANDAGES/DRESSINGS) IMPLANT
DRSG MEPITEL 4X7.2 (GAUZE/BANDAGES/DRESSINGS) ×4 IMPLANT
DRSG PAD ABDOMINAL 8X10 ST (GAUZE/BANDAGES/DRESSINGS) ×8 IMPLANT
ELECT REM PT RETURN 9FT ADLT (ELECTROSURGICAL) ×4
ELECTRODE REM PT RTRN 9FT ADLT (ELECTROSURGICAL) ×2 IMPLANT
GAUZE SPONGE 4X4 12PLY STRL (GAUZE/BANDAGES/DRESSINGS) ×4 IMPLANT
GLOVE BIO SURGEON STRL SZ7 (GLOVE) ×4 IMPLANT
GLOVE BIO SURGEON STRL SZ8 (GLOVE) ×8 IMPLANT
GLOVE BIOGEL PI IND STRL 7.5 (GLOVE) ×4 IMPLANT
GLOVE BIOGEL PI IND STRL 8 (GLOVE) ×6 IMPLANT
GLOVE BIOGEL PI INDICATOR 7.5 (GLOVE) ×4
GLOVE BIOGEL PI INDICATOR 8 (GLOVE) ×6
GLOVE ECLIPSE 8.0 STRL XLNG CF (GLOVE) ×4 IMPLANT
GOWN STRL REUS W/ TWL LRG LVL3 (GOWN DISPOSABLE) IMPLANT
GOWN STRL REUS W/ TWL XL LVL3 (GOWN DISPOSABLE) ×6 IMPLANT
GOWN STRL REUS W/TWL LRG LVL3 (GOWN DISPOSABLE)
GOWN STRL REUS W/TWL XL LVL3 (GOWN DISPOSABLE) ×10 IMPLANT
INSERT TIB FB ANKLE SZ 2X7 RT (Ankle) ×4 IMPLANT
K-WIRE 1.8 (WIRE) ×2
K-WIRE FX200X1.8XTROC TIP (WIRE) ×2
KIT STAPLE ARCUS 20X17 STRL (Staple) ×4 IMPLANT
KWIRE FX200X1.8XTROC TIP (WIRE) ×2 IMPLANT
NDL SAFETY ECLIPSE 18X1.5 (NEEDLE) ×2 IMPLANT
NEEDLE HYPO 18GX1.5 SHARP (NEEDLE) ×2
NEEDLE HYPO 22GX1.5 SAFETY (NEEDLE) IMPLANT
NS IRRIG 1000ML POUR BTL (IV SOLUTION) ×4 IMPLANT
PACK BASIN DAY SURGERY FS (CUSTOM PROCEDURE TRAY) ×4 IMPLANT
PAD CAST 4YDX4 CTTN HI CHSV (CAST SUPPLIES) ×4 IMPLANT
PADDING CAST ABS 4INX4YD NS (CAST SUPPLIES)
PADDING CAST ABS COTTON 4X4 ST (CAST SUPPLIES) IMPLANT
PADDING CAST COTTON 4X4 STRL (CAST SUPPLIES) ×4
PADDING CAST COTTON 6X4 STRL (CAST SUPPLIES) ×4 IMPLANT
PENCIL SMOKE EVACUATOR (MISCELLANEOUS) ×4 IMPLANT
PIN POUCH TALAR VANTAGE 2.0 (PIN) ×4 IMPLANT
PIN POUCH TALAR VANTAGE 2.5 (PIN) ×4 IMPLANT
PIN POUCH TALAR VANTAGE 3.5 (PIN) ×8 IMPLANT
PLATE TIB BEARING VT SZ 2 RT (Plate) ×4 IMPLANT
POUCH SCREW TALAR ANKLE (ORTHOPEDIC DISPOSABLE SUPPLIES) ×4 IMPLANT
SANITIZER HAND PURELL 535ML FO (MISCELLANEOUS) ×4 IMPLANT
SAW BLADE (BLADE) ×4 IMPLANT
SCOTCHCAST PLUS 3X4 WHITE (CAST SUPPLIES) ×8 IMPLANT
SCOTCHCAST PLUS 4X4 WHITE (CAST SUPPLIES) ×4 IMPLANT
SCREW CANN 5.0X30MM (Screw) ×4 IMPLANT
SHEET MEDIUM DRAPE 40X70 STRL (DRAPES) ×4 IMPLANT
SLEEVE SCD COMPRESS KNEE MED (MISCELLANEOUS) ×4 IMPLANT
SPLINT FAST PLASTER 5X30 (CAST SUPPLIES)
SPLINT PLASTER CAST FAST 5X30 (CAST SUPPLIES) IMPLANT
SPONGE LAP 18X18 RF (DISPOSABLE) ×4 IMPLANT
SPONGE SURGIFOAM ABS GEL 12-7 (HEMOSTASIS) IMPLANT
STOCKINETTE 6  STRL (DRAPES) ×2
STOCKINETTE 6 STRL (DRAPES) ×2 IMPLANT
SUCTION FRAZIER HANDLE 10FR (MISCELLANEOUS) ×2
SUCTION TUBE FRAZIER 10FR DISP (MISCELLANEOUS) ×2 IMPLANT
SURGILUBE 2OZ TUBE FLIPTOP (MISCELLANEOUS) ×4 IMPLANT
SUT ETHILON 3 0 PS 1 (SUTURE) ×8 IMPLANT
SUT MNCRL AB 3-0 PS2 18 (SUTURE) ×4 IMPLANT
SUT VIC AB 0 CT1 27 (SUTURE) ×4
SUT VIC AB 0 CT1 27XBRD ANBCTR (SUTURE) ×4 IMPLANT
SUT VIC AB 0 SH 27 (SUTURE) IMPLANT
SUT VIC AB 2-0 SH 27 (SUTURE) ×4
SUT VIC AB 2-0 SH 27XBRD (SUTURE) ×4 IMPLANT
SYR 20ML LL LF (SYRINGE) ×4 IMPLANT
SYR BULB IRRIGATION 50ML (SYRINGE) ×4 IMPLANT
TOWEL GREEN STERILE FF (TOWEL DISPOSABLE) ×12 IMPLANT
TUBE CONNECTING 20'X1/4 (TUBING) ×1
TUBE CONNECTING 20X1/4 (TUBING) ×3 IMPLANT
UNDERPAD 30X36 HEAVY ABSORB (UNDERPADS AND DIAPERS) ×4 IMPLANT
YANKAUER SUCT BULB TIP NO VENT (SUCTIONS) ×4 IMPLANT

## 2019-03-08 NOTE — Progress Notes (Signed)
Pre block vitals

## 2019-03-08 NOTE — Anesthesia Procedure Notes (Signed)
Procedure Name: LMA Insertion Date/Time: 03/08/2019 7:54 AM Performed by: Signe Colt, CRNA Pre-anesthesia Checklist: Patient identified, Emergency Drugs available, Suction available and Patient being monitored Patient Re-evaluated:Patient Re-evaluated prior to induction Oxygen Delivery Method: Circle system utilized Preoxygenation: Pre-oxygenation with 100% oxygen Induction Type: IV induction Ventilation: Mask ventilation without difficulty LMA: LMA inserted LMA Size: 4.0 Number of attempts: 1 Airway Equipment and Method: Bite block Placement Confirmation: positive ETCO2 Tube secured with: Tape Dental Injury: Teeth and Oropharynx as per pre-operative assessment

## 2019-03-08 NOTE — Progress Notes (Signed)
Assisted Dr. Valma Cava with right, ultrasound guided, popliteal/saphenous block. Side rails up, monitors on throughout procedure. See vital signs in flow sheet. Tolerated Procedure well.

## 2019-03-08 NOTE — Anesthesia Procedure Notes (Addendum)
Anesthesia Regional Block: Popliteal block   Pre-Anesthetic Checklist: ,, timeout performed, Correct Patient, Correct Site, Correct Laterality, Correct Procedure, Correct Position, site marked, Risks and benefits discussed, pre-op evaluation,  At surgeon's request and post-op pain management  Laterality: Right and Lower  Prep: Maximum Sterile Barrier Precautions used, chloraprep       Needles:  Injection technique: Single-shot  Needle Type: Echogenic Needle     Needle Length: 9cm  Needle Gauge: 21     Additional Needles:   Procedures:,,,, ultrasound used (permanent image in chart),,,,  Narrative:  Start time: 03/08/2019 7:31 AM End time: 03/08/2019 7:39 AM Injection made incrementally with aspirations every 5 mL.  Performed by: Personally  Anesthesiologist: Barnet Glasgow, MD  Additional Notes: Block assessed. Patient tolerated procedure well.

## 2019-03-08 NOTE — Anesthesia Procedure Notes (Signed)
Anesthesia Regional Block: Adductor canal block   Pre-Anesthetic Checklist: ,, timeout performed, Correct Patient, Correct Site, Correct Laterality, Correct Procedure, Correct Position, site marked, Risks and benefits discussed,  Surgical consent,  Pre-op evaluation,  At surgeon's request and post-op pain management  Laterality: Lower and Right  Prep: chloraprep       Needles:  Injection technique: Single-shot  Needle Type: Echogenic Needle     Needle Length: 9cm  Needle Gauge: 22     Additional Needles:   Procedures:,,,, ultrasound used (permanent image in chart),,,,  Narrative:  Start time: 03/08/2019 7:40 AM End time: 03/08/2019 7:44 AM Injection made incrementally with aspirations every 5 mL.  Performed by: Personally  Anesthesiologist: Barnet Glasgow, MD  Additional Notes: Block assessed prior to surgery. Pt tolerated procedure well.

## 2019-03-08 NOTE — Anesthesia Postprocedure Evaluation (Signed)
Anesthesia Post Note  Patient: Laurie Burch  Procedure(s) Performed: Right Total Ankle Arthroplasty and Exostectomy of Navicular (Right Ankle) Talonavicular Joint Arthrodesis (Right Foot) Percutaneous Achilles Tendon Lengthening (Right Foot)     Patient location during evaluation: PACU Anesthesia Type: General Level of consciousness: awake and alert Pain management: pain level controlled Vital Signs Assessment: post-procedure vital signs reviewed and stable Respiratory status: spontaneous breathing, nonlabored ventilation, respiratory function stable and patient connected to nasal cannula oxygen Cardiovascular status: blood pressure returned to baseline and stable Postop Assessment: no apparent nausea or vomiting Anesthetic complications: no    Last Vitals:  Vitals:   03/08/19 1012 03/08/19 1015  BP:  (!) 146/71  Pulse: 65 63  Resp: 16 16  Temp:    SpO2: 100% 100%    Last Pain:  Vitals:   03/08/19 1012  TempSrc:   PainSc: 0-No pain                 Barnet Glasgow

## 2019-03-08 NOTE — H&P (Signed)
Laurie Burch is an 69 y.o. female.   Chief Complaint:  Right ankle pain HPI: The patient is a 69 year old female with a history of type 2 diabetes.  She has a long history of right ankle pain due to severe arthritis.  Her talonavicular joint is also severely arthritic.  She has failed nonoperative treatment to date including activity modification, oral anti-inflammatories and bracing.  She presents today for surgical treatment of this painful and limiting condition.  Past Medical History:  Diagnosis Date  . Allergic rhinitis   . Allergy   . Anemia   . Arthritis    ankle, neck  . Asthma    no problems now  . Bronchitis    hx of  . Cataract    bilateral - small cataracts, no tx yet  . 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 Diabetes mellitus without complication ([place])    Type II,  dx 2008  . Gastritis    Takes OTC PRN  . GERD (gastroesophageal reflux disease)    Not taking medications , "its better". OTC med PRN  . Head injury, acute, without loss of consciousness    years ago  . Headache(784.0)   . Heart murmur    Saw Dr. Marlou Porch 4-5 yrs ago  . Hyperlipemia   . Hypertension   . Lumbar stenosis    Hx  . Meningitis 1993  . Mitral valve prolapse   . OA (osteoarthritis)   . Osteopenia   . Overactive bladder   . PONV (postoperative nausea and vomiting)    Years ago- whe I had shut out in    Past Surgical History:  Procedure Laterality Date  . ABDOMINAL HYSTERECTOMY  1989  . ANKLE SURGERY Right 1978  . ANTERIOR CERVICAL DECOMP/DISCECTOMY FUSION N/A 06/07/2013   Procedure: EXPLORATION OF FUSION AND REMOVAL OF CERVICAL HARDWARE AND ACDF C4-5;  Surgeon: Melina Schools, MD;  Location: [facility];  Service: Orthopedics;  Laterality: N/A;  . ANTERIOR CRUCIATE LIGAMENT REPAIR Right 2015  . ANTERIOR LAT LUMBAR FUSION Right 02/11/2017   Procedure: Right Lumbar three-four Anterolateral lumbar interbody fusion with plate;  Surgeon: Erline Levine, MD;  Location: [facility];  Service:  Neurosurgery;  Laterality: Right;  . APPENDECTOMY  1976  . Arthoscopy Right 1996   right knee  . CERVICAL SPINE SURGERY  2008  . CESAREAN SECTION  1981  . COLONOSCOPY  2006   [facility]ner - internal hemorrhoid  . INSERT INTRAPERITONEAL CATHETER     reinsert-cath  . LUMBAR LAMINECTOMY/DECOMPRESSION MICRODISCECTOMY N/A 08/14/2014   Procedure: LUMBAR DECOMPRESSION L3-5, LEFT SIDED DISCECTOMY L3-4;  Surgeon: Melina Schools, MD;  Location: [facility];  Service: Orthopedics;  Laterality: N/A;  . LUMBAR PERITONEAL SHUNT  1984   Pseudo- Tumor- Cerebral  . LYMPH NODE DISSECTION Left   . [place]  . SYNOVECTOMY  1992   Flexox Tendon  . TONSILLECTOMY  1968   and Adnoids  . UPPER GASTROINTESTINAL ENDOSCOPY    . VEIN LIGATION AND STRIPPING Bilateral 1995  . vertebra Fracture  2000    Family History  Problem Relation Age of Onset  . Colon polyps Mother   . Colon polyps Father   . Stomach cancer Paternal Aunt   . Esophageal cancer Paternal Uncle   . Colon cancer Neg Hx   . Rectal cancer Neg Hx   . Prostate cancer Neg Hx    Social History:  reports that she has never smoked. She has  never used smokeless tobacco. She reports that she does not drink alcohol or use drugs.  Allergies:  Allergies  Allergen Reactions  . Crestor [Rosuvastatin Calcium] Other (See Comments)    Muscle pain, fatigue  . Irbesartan Other (See Comments)    Myalgias, Body aches  . Lipitor [Atorvastatin] Other (See Comments)    Muscle pain, fatigue  . Lisinopril Cough  . Glucotrol [Glipizide]     UNSPECIFIED REACTION    . Prandin [Repaglinide]     UNSPECIFIED REACTION   . Prednisone     UNSPECIFIED REACTION AT LARGE DOSES  . Simvastatin Other (See Comments)    Muscle aches, fatigue  . Prilosec [Omeprazole] Rash and Other (See Comments)    INTOLERANCE >  GAS    Facility-Administered Medications Prior to Admission  Medication Dose Route Frequency Provider Last Rate Last Admin  . 0.9 %  sodium  chloride infusion  500 mL Intravenous Once Gatha Mayer, MD       Medications Prior to Admission  Medication Sig Dispense Refill  . aspirin EC 81 MG tablet Take 81 mg by mouth daily.    . bisoprolol-hydrochlorothiazide (ZIAC) 2.5-6.25 MG per tablet Take 2 tablets by mouth daily.    . Cinnamon 500 MG TABS Take 500 mg by mouth daily.    Marland Kitchen esomeprazole (NEXIUM) 40 MG capsule TAKE 1 CAPSULE DAILY BEFOREBREAKFAST 90 capsule 3  . gabapentin (NEURONTIN) 300 MG capsule Take 1 capsule (300 mg total) by mouth 3 (three) times daily. 90 capsule 2  . glyBURIDE micronized (GLYNASE) 3 MG tablet Take 3-6 mg by mouth See admin instructions. Take 2 tablets in the morning, then 1 tablet in the afternoon    . IRON PO Take 1 tablet by mouth daily. 65mg     . magnesium oxide (MAG-OX) 400 MG tablet Take 400 mg by mouth daily.    . metFORMIN (GLUCOPHAGE-XR) 500 MG 24 hr tablet Take 1,000 mg by mouth 2 (two) times daily.    . Multiple Vitamins-Minerals (MULTIVITAMIN WITH MINERALS) tablet Take 1 tablet by mouth daily.    Marland Kitchen omega-3 acid ethyl esters (LOVAZA) 1 G capsule Take 1 g by mouth daily. MEGA RED    . pravastatin (PRAVACHOL) 80 MG tablet Take 80 mg by mouth at bedtime.     . sitaGLIPtin (JANUVIA) 100 MG tablet Take 100 mg by mouth daily.    Marland Kitchen albuterol (PROVENTIL HFA;VENTOLIN HFA) 108 (90 BASE) MCG/ACT inhaler Inhale 2 puffs into the lungs 4 (four) times daily as needed for wheezing or shortness of breath.    . traMADol (ULTRAM) 50 MG tablet TAKE 1 TABLET BY MOUTH TWICE A DAY AS NEEDED FOR JOINT PAIN  5    Results for orders placed or performed during the hospital encounter of 03/08/19 (from the past 48 hour(s))  Glucose, capillary     Status: None   Collection Time: 03/08/19  6:50 AM  Result Value Ref Range   Glucose-Capillary 89 70 - 99 mg/dL   No results found.  Review of Systems no recent fever, chills, nausea, vomiting or changes in her appetite  Blood pressure (!) 152/76, pulse 71, temperature (!)  97 F (36.1 C), temperature source Oral, resp. rate (!) 23, height 5\' 3"  (1.6 m), weight 78.4 kg, SpO2 96 %. Physical Exam  Well-nourished well-developed woman in no apparent distress.  Alert and oriented x4.  Mood and affect are normal.  Extraocular motions are intact.  Respirations are unlabored.  Gait is antalgic to the right.  The right ankle has decreased range of motion in plantarflexion and dorsiflexion.  Inversion and eversion range of motion is restricted as well.  Skin is healthy and intact.  Pulses are palpable.  No lymphadenopathy.  Intact sensibility to light touch dorsally and plantarly at the forefoot.  Assessment/Plan Right ankle and talonavicular joint arthritis -to the operating room today for total ankle replacement and talonavicular joint arthrodesis.  The risks and benefits of the alternative treatment options have been discussed in detail.  The patient wishes to proceed with surgery and specifically understands risks of bleeding, infection, nerve damage, blood clots, need for additional surgery, amputation and death.   Wylene Simmer, MD 31-Mar-2019, 7:25 AM

## 2019-03-08 NOTE — Op Note (Addendum)
03/08/2019  10:17 AM  PATIENT:  Laurie Burch  69 y.o. female  PRE-OPERATIVE DIAGNOSIS: 1.  Traumatic arthropathy right ankle      2.  Right foot talonavicular joint arthritis  POST-OPERATIVE DIAGNOSIS:   1.  Traumatic arthropathy right ankle      2.  Right foot talonavicular joint arthritis      3.  Right short achilles tendon  Procedure(s): 1.  Right total ankle replacement 2.  Right talonavicular joint arthrodesis 3.  Autograft bone from the distal tibia to the talonavicular joint 4.  Percutaneous Achilles tendon lengthening  IMPLANTS:  Exact[employer] Vantage size 2 tibia, size 2 talus, 7 mm poly spacer  SURGEON:  Wylene Simmer, MD  ASSISTANT: Mechele Claude, PA-C  ANESTHESIA:   General, regional  EBL:  minimal   TOURNIQUET:   Total Tourniquet Time Documented: Thigh (Right) - 106 minutes Total: Thigh (Right) - [id number] minutes  COMPLICATIONS:  None apparent  DISPOSITION:  Extubated, awake and stable to recovery.  INDICATION FOR PROCEDURE: The patient is a 69 year old female with a long history of right ankle pain due to posttraumatic arthritis.  She also has severe talonavicular joint arthritis.  She has failed nonoperative treatment to date including activity modification, oral anti-inflammatories, intra-articular steroid injection and bracing.  She presents now for operative treatment of this painful and limiting condition.  The risks and benefits of the alternative treatment options have been discussed in detail.  The patient wishes to proceed with surgery and specifically understands risks of bleeding, infection, nerve damage, blood clots, need for additional surgery, amputation and death.  PROCEDURE IN DETAIL:  After pre operative consent was obtained, and the correct operative site was identified, the patient was brought to the operating room and placed supine on the OR table.  Anesthesia was administered.  Pre-operative antibiotics were administered.  A surgical timeout was taken.   The right lower extremity was prepped and draped in standard sterile fashion with a tourniquet around the thigh.  The extremity was elevated and the tourniquet was inflated to 250 mmHg.  An incision was then made over the anterior aspect of the ankle.  Dissection was carried down through the subcutaneous tissues.  The interval between the extensor houses longus and tibialis anterior tendons was developed.  The neurovascular bundle was mobilized and retracted laterally.  It was protected throughout the case.  The anterior joint capsule was incised and elevated medially and laterally.  There were large osteophytes off of the anterior lip of the tibia and off of the neck of the talus.  These were removed with an osteotome and rondure.  The talonavicular joint was then opened by incising the capsule.  The joint was noted to have no remaining articular cartilage.  Wound was irrigated copiously.  Osteophytes were removed with a rondure.  A small drill bit was used to perforate the remaining subchondral bone on both sides of the joint.  Attention was then returned to the ankle joint.  A guide was placed in the medial gutter.  The tibial tubercle pin was then inserted through a small incision in line with the sword.  The external alignment guide was then placed on the tibial tubercle pin and aligned at the medial gutter.  An AP radiograph confirmed appropriate alignment.  Rotation was set in line with the medial gutter.  A lateral view was then obtained.  Slope and resection height were set with the angel wing.  The distal cutting block was pinned.  An AP  view was then obtained in the medial lateral position set.  The medial and lateral extents of the cut were pinned.  The oscillating saw was then used to cut through the distal tibia.  The reciprocating saw was used to cut up from the medial gutter to the medial pin.  Both pins were removed.  All cut bone was removed after morselize them.  The cutting guide was then  placed against the dome of the talus.  A lateral view was obtained and the cutting guide pinned in position.  The talar dome cut was made in the cut bone removed.  The size 2 spacer was inserted and was noted to fit appropriately.  The tibial cutting guide was removed.  The talar lollipop was inserted and positioned appropriately.  A lateral radiograph confirmed appropriate position.  It was pinned.  The lollipop was removed and replaced with the talar cutting guide which was then pinned into position medially and laterally.  The 4 talar cuts were made in the cutting guide removed.  All cut bone was removed.  The talar rasp was then used to smooth the cuts.  The talar trial was inserted.  A lateral view showed appropriate position of the trial.  It was positioned appropriately in the medial lateral position and a screw inserted.  The 2 lug holes were drilled.  The tibia was then measured.  The tibial trial was inserted with an 8 mm punch liner.  Radiographs showed appropriate position of the tibial implant.  The trial was removed followed by the talar implant.  The peripheral holes were punched followed by the central hole.  All trials were removed.  The wound was irrigated copiously.  Vancomycin powder was applied to the backside of the tibial implant.  It was inserted and impacted in position.  A radiograph confirmed appropriate position.  The talar implant was then inserted after coating with vancomycin powder.  It was impacted into position.  A radiograph confirmed appropriate alignment.  A 7 mm trial polyethylene spacer was inserted.  The heel cord was noted to be somewhat tight.  Radiographs confirmed appropriate position of the implants.  The trial polyethylene spacer was removed and replaced with a 7 mm polyethylene implant.  The locking clip was inserted and was noted to fit appropriately.  Final AP, mortise and lateral radiographs showed appropriate position of the implants and appropriate alignment.  The  heel cord was then lengthened with a triple hemisection percutaneous technique.  Ankle dorsiflexion was approximately 20 degrees with the knee extended.  Attention was then returned to the talonavicular joint.  The joint was opened and packed with autograft bone taken from the distal tibia.  The joint was reduced.  A guidewire for the 5 mm partially-threaded Biomet cannulated screws was inserted.  AP and lateral radiographs of the foot showed appropriate reduction of the talonavicular joint and appropriate position of the guidepin.  The guidepin was overdrilled and a 30 mm screw was inserted.  It was noted to have excellent purchase and compressed the talonavicular joint appropriately.  The lateral half of the joint was then compressed.  A 20 mm arcus staple was inserted and was noted to have excellent purchase.  Final AP and lateral radiographs of the foot showed appropriate reduction of the talonavicular joint and appropriate position of the hardware.  Wound was irrigated copiously.  Vancomycin powder was sprinkled in the deep layer of the wound.  The anterior joint capsule was repaired with Vicryl.  The extensor  retinaculum was repaired with Vicryl.  Subcutaneous tissues were repaired with Monocryl.  The skin incision was closed with nylon.  Sterile dressings were applied followed by a well-padded short leg splint.  Tourniquet was released after application of the dressings.  The patient was awakened from anesthesia and transported to the recovery room in stable condition.   FOLLOW UP PLAN: Nonweightbearing on the right lower extremity.  Observation overnight for pain control.  Follow-up in the office in 3 weeks for suture removal and conversion to a cam boot depending on how the wound looks.  Aspirin for DVT prophylaxis.    Mechele Claude PA-C was present and scrubbed for the duration of the operative case. His assistance was essential in positioning the patient, prepping and draping, gaining and  maintaining exposure, performing the operation, closing and dressing the wounds and applying the splint.

## 2019-03-08 NOTE — Progress Notes (Signed)
Post block vitals

## 2019-03-08 NOTE — Discharge Instructions (Addendum)
Wylene Simmer, MD [facility]  Please read the following information regarding your care after surgery.  Medications  You only need a prescription for the narcotic pain medicine (ex. oxycodone, Percocet, Norco).  All of the other medicines listed below are available over the counter. X Aleve 2 pills twice a day for the first 3 days after surgery. X acetominophen (Tylenol) 650 mg every 4-6 hours as you need for minor to moderate pain X oxycodone as prescribed for severe pain  Narcotic pain medicine (ex. oxycodone, Percocet, Vicodin) will cause constipation.  To prevent this problem, take the following medicines while you are taking any pain medicine. X docusate sodium (Colace) 100 mg twice a day X senna (Senokot) 2 tablets twice a day  X To help prevent blood clots, take a baby aspirin (81 mg) twice a day after surgery.  You should also get up every hour while you are awake to move around.    Weight Bearing X Do not bear any weight on the operated leg or foot.  Cast / Splint / Dressing X Keep your splint, cast or dressing clean and dry.  Don't put anything (coat hanger, pencil, etc) down inside of it.  If it gets damp, use a hair dryer on the cool setting to dry it.  If it gets soaked, call the office to schedule an appointment for a cast change.   After your dressing, cast or splint is removed; you may shower, but do not soak or scrub the wound.  Allow the water to run over it, and then gently pat it dry.  Swelling It is normal for you to have swelling where you had surgery.  To reduce swelling and pain, keep your toes above your nose for at least 3 days after surgery.  It may be necessary to keep your foot or leg elevated for several weeks.  If it hurts, it should be elevated.  Follow Up Call my office at [phone] when you are discharged from the hospital or surgery center to schedule an appointment to be seen two weeks after surgery.  Call my office at [phone] if you develop a  fever >101.5 F, nausea, vomiting, bleeding from the surgical site or severe pain.    Post Anesthesia Home Care Instructions  Activity: Get plenty of rest for the remainder of the day. A responsible individual must stay with you for 24 hours following the procedure.  For the next 24 hours, DO NOT: -Drive a car -Paediatric nurse -Drink alcoholic beverages -Take any medication unless instructed by your physician -Make any legal decisions or sign important papers.  Meals: Start with liquid foods such as gelatin or soup. Progress to regular foods as tolerated. Avoid greasy, spicy, heavy foods. If nausea and/or vomiting occur, drink only clear liquids until the nausea and/or vomiting subsides. Call your physician if vomiting continues.  Special Instructions/Symptoms: Your throat may feel dry or sore from the anesthesia or the breathing tube placed in your throat during surgery. If this causes discomfort, gargle with warm salt water. The discomfort should disappear within 24 hours.  If you had a scopolamine patch placed behind your ear for the management of post- operative nausea and/or vomiting:  1. The medication in the patch is effective for 72 hours, after which it should be removed.  Wrap patch in a tissue and discard in the trash. Wash hands thoroughly with soap and water. 2. You may remove the patch earlier than 72 hours if you experience unpleasant side effects which  may include dry mouth, dizziness or visual disturbances. 3. Avoid touching the patch. Wash your hands with soap and water after contact with the patch.    Regional Anesthesia Blocks  1. Numbness or the inability to move the "blocked" extremity may last from 3-48 hours after placement. The length of time depends on the medication injected and your individual response to the medication. If the numbness is not going away after 48 hours, call your surgeon.  2. The extremity that is blocked will need to be protected until the  numbness is gone and the  Strength has returned. Because you cannot feel it, you will need to take extra care to avoid injury. Because it may be weak, you may have difficulty moving it or using it. You may not know what position it is in without looking at it while the block is in effect.  3. For blocks in the legs and feet, returning to weight bearing and walking needs to be done carefully. You will need to wait until the numbness is entirely gone and the strength has returned. You should be able to move your leg and foot normally before you try and bear weight or walk. You will need someone to be with you when you first try to ensure you do not fall and possibly risk injury.  4. Bruising and tenderness at the needle site are common side effects and will resolve in a few days.  5. Persistent numbness or new problems with movement should be communicated to the surgeon or the [place] [phone] [place] [phone]).

## 2019-03-08 NOTE — Transfer of Care (Signed)
Immediate Anesthesia Transfer of Care Note  Patient: JELLISA MCTEE  Procedure(s) Performed: Right Total Ankle Arthroplasty and Exostectomy of Navicular (Right Ankle) Talonavicular Joint Arthrodesis (Right Foot) Percutaneous Achilles Tendon Lengthening (Right Foot)  Patient Location: PACU  Anesthesia Type:GA combined with regional for post-op pain  Level of Consciousness: drowsy and patient cooperative  Airway & Oxygen Therapy: Patient Spontanous Breathing and Patient connected to face mask oxygen  Post-op Assessment: Report given to RN and Post -op Vital signs reviewed and stable  Post vital signs: Reviewed and stable  Last Vitals:  Vitals Value Taken Time  BP 131/69 03/08/19 1002  Temp    Pulse 66 03/08/19 1003  Resp 17 03/08/19 1003  SpO2 99 % 03/08/19 1003  Vitals shown include unvalidated device data.  Last Pain:  Vitals:   03/08/19 0642  TempSrc: Oral  PainSc: 4       Patients Stated Pain Goal: 4 ([id number])  Complications: No apparent anesthesia complications

## 2019-03-09 DIAGNOSIS — M19071 Primary osteoarthritis, right ankle and foot: Secondary | ICD-10-CM | POA: Diagnosis not present

## 2019-03-09 LAB — GLUCOSE, CAPILLARY: Glucose-Capillary: 100 mg/dL — ABNORMAL HIGH (ref 70–99)

## 2019-03-11 ENCOUNTER — Encounter: Payer: Self-pay | Admitting: *Deleted

## 2019-03-23 DIAGNOSIS — Z4789 Encounter for other orthopedic aftercare: Secondary | ICD-10-CM | POA: Diagnosis not present

## 2019-03-23 DIAGNOSIS — M12571 Traumatic arthropathy, right ankle and foot: Secondary | ICD-10-CM | POA: Diagnosis not present

## 2019-04-20 DIAGNOSIS — M25571 Pain in right ankle and joints of right foot: Secondary | ICD-10-CM | POA: Diagnosis not present

## 2019-04-20 DIAGNOSIS — Z4789 Encounter for other orthopedic aftercare: Secondary | ICD-10-CM | POA: Diagnosis not present

## 2019-05-18 DIAGNOSIS — M25571 Pain in right ankle and joints of right foot: Secondary | ICD-10-CM | POA: Diagnosis not present

## 2019-05-18 DIAGNOSIS — M79671 Pain in right foot: Secondary | ICD-10-CM | POA: Diagnosis not present

## 2019-05-18 DIAGNOSIS — Z4789 Encounter for other orthopedic aftercare: Secondary | ICD-10-CM | POA: Diagnosis not present

## 2019-05-29 DIAGNOSIS — M25571 Pain in right ankle and joints of right foot: Secondary | ICD-10-CM | POA: Diagnosis not present

## 2019-06-04 DIAGNOSIS — M25571 Pain in right ankle and joints of right foot: Secondary | ICD-10-CM | POA: Diagnosis not present

## 2019-06-06 DIAGNOSIS — M25571 Pain in right ankle and joints of right foot: Secondary | ICD-10-CM | POA: Diagnosis not present

## 2019-06-11 DIAGNOSIS — M25571 Pain in right ankle and joints of right foot: Secondary | ICD-10-CM | POA: Diagnosis not present

## 2019-06-14 DIAGNOSIS — M25571 Pain in right ankle and joints of right foot: Secondary | ICD-10-CM | POA: Diagnosis not present

## 2019-06-15 DIAGNOSIS — M25571 Pain in right ankle and joints of right foot: Secondary | ICD-10-CM | POA: Diagnosis not present

## 2019-06-15 DIAGNOSIS — M79671 Pain in right foot: Secondary | ICD-10-CM | POA: Diagnosis not present

## 2019-06-15 DIAGNOSIS — Z96661 Presence of right artificial ankle joint: Secondary | ICD-10-CM | POA: Diagnosis not present

## 2019-06-19 DIAGNOSIS — M25571 Pain in right ankle and joints of right foot: Secondary | ICD-10-CM | POA: Diagnosis not present

## 2019-09-21 DIAGNOSIS — N183 Chronic kidney disease, stage 3 unspecified: Secondary | ICD-10-CM | POA: Diagnosis not present

## 2019-09-21 DIAGNOSIS — R319 Hematuria, unspecified: Secondary | ICD-10-CM | POA: Diagnosis not present

## 2019-09-21 DIAGNOSIS — E1165 Type 2 diabetes mellitus with hyperglycemia: Secondary | ICD-10-CM | POA: Diagnosis not present

## 2019-09-21 DIAGNOSIS — E782 Mixed hyperlipidemia: Secondary | ICD-10-CM | POA: Diagnosis not present

## 2019-09-21 DIAGNOSIS — M150 Primary generalized (osteo)arthritis: Secondary | ICD-10-CM | POA: Diagnosis not present

## 2019-09-21 DIAGNOSIS — Z7984 Long term (current) use of oral hypoglycemic drugs: Secondary | ICD-10-CM | POA: Diagnosis not present

## 2019-09-21 DIAGNOSIS — N390 Urinary tract infection, site not specified: Secondary | ICD-10-CM | POA: Diagnosis not present

## 2019-09-21 DIAGNOSIS — I10 Essential (primary) hypertension: Secondary | ICD-10-CM | POA: Diagnosis not present

## 2020-01-09 DIAGNOSIS — M4126 Other idiopathic scoliosis, lumbar region: Secondary | ICD-10-CM | POA: Diagnosis not present

## 2020-01-09 DIAGNOSIS — M5416 Radiculopathy, lumbar region: Secondary | ICD-10-CM | POA: Diagnosis not present

## 2020-01-09 DIAGNOSIS — M545 Low back pain, unspecified: Secondary | ICD-10-CM | POA: Diagnosis not present

## 2020-03-25 DIAGNOSIS — K219 Gastro-esophageal reflux disease without esophagitis: Secondary | ICD-10-CM | POA: Diagnosis not present

## 2020-03-25 DIAGNOSIS — N183 Chronic kidney disease, stage 3 unspecified: Secondary | ICD-10-CM | POA: Diagnosis not present

## 2020-03-25 DIAGNOSIS — E1169 Type 2 diabetes mellitus with other specified complication: Secondary | ICD-10-CM | POA: Diagnosis not present

## 2020-03-25 DIAGNOSIS — I10 Essential (primary) hypertension: Secondary | ICD-10-CM | POA: Diagnosis not present

## 2020-03-25 DIAGNOSIS — E782 Mixed hyperlipidemia: Secondary | ICD-10-CM | POA: Diagnosis not present

## 2020-03-25 DIAGNOSIS — M150 Primary generalized (osteo)arthritis: Secondary | ICD-10-CM | POA: Diagnosis not present

## 2020-04-14 DIAGNOSIS — J069 Acute upper respiratory infection, unspecified: Secondary | ICD-10-CM | POA: Diagnosis not present

## 2020-04-14 DIAGNOSIS — Z20822 Contact with and (suspected) exposure to covid-19: Secondary | ICD-10-CM | POA: Diagnosis not present

## 2020-04-14 DIAGNOSIS — R519 Headache, unspecified: Secondary | ICD-10-CM | POA: Diagnosis not present

## 2020-04-14 DIAGNOSIS — R059 Cough, unspecified: Secondary | ICD-10-CM | POA: Diagnosis not present

## 2020-04-14 DIAGNOSIS — R0981 Nasal congestion: Secondary | ICD-10-CM | POA: Diagnosis not present

## 2020-05-23 DIAGNOSIS — Z96661 Presence of right artificial ankle joint: Secondary | ICD-10-CM | POA: Diagnosis not present

## 2020-05-23 DIAGNOSIS — M25571 Pain in right ankle and joints of right foot: Secondary | ICD-10-CM | POA: Diagnosis not present

## 2020-08-13 DIAGNOSIS — Z7984 Long term (current) use of oral hypoglycemic drugs: Secondary | ICD-10-CM | POA: Diagnosis not present

## 2020-08-13 DIAGNOSIS — E1169 Type 2 diabetes mellitus with other specified complication: Secondary | ICD-10-CM | POA: Diagnosis not present

## 2020-08-13 DIAGNOSIS — J4520 Mild intermittent asthma, uncomplicated: Secondary | ICD-10-CM | POA: Diagnosis not present

## 2020-08-13 DIAGNOSIS — U071 COVID-19: Secondary | ICD-10-CM | POA: Diagnosis not present

## 2020-09-16 DIAGNOSIS — Z780 Asymptomatic menopausal state: Secondary | ICD-10-CM | POA: Diagnosis not present

## 2020-10-06 DIAGNOSIS — I10 Essential (primary) hypertension: Secondary | ICD-10-CM | POA: Diagnosis not present

## 2020-10-06 DIAGNOSIS — E782 Mixed hyperlipidemia: Secondary | ICD-10-CM | POA: Diagnosis not present

## 2020-10-06 DIAGNOSIS — E1165 Type 2 diabetes mellitus with hyperglycemia: Secondary | ICD-10-CM | POA: Diagnosis not present

## 2020-10-24 DIAGNOSIS — H2513 Age-related nuclear cataract, bilateral: Secondary | ICD-10-CM | POA: Diagnosis not present

## 2020-10-24 DIAGNOSIS — E119 Type 2 diabetes mellitus without complications: Secondary | ICD-10-CM | POA: Diagnosis not present

## 2020-10-24 DIAGNOSIS — H2512 Age-related nuclear cataract, left eye: Secondary | ICD-10-CM | POA: Diagnosis not present

## 2020-11-11 DIAGNOSIS — M2550 Pain in unspecified joint: Secondary | ICD-10-CM | POA: Diagnosis not present

## 2020-11-11 DIAGNOSIS — M1990 Unspecified osteoarthritis, unspecified site: Secondary | ICD-10-CM | POA: Diagnosis not present

## 2020-11-11 DIAGNOSIS — I10 Essential (primary) hypertension: Secondary | ICD-10-CM | POA: Diagnosis not present

## 2020-11-11 DIAGNOSIS — E119 Type 2 diabetes mellitus without complications: Secondary | ICD-10-CM | POA: Diagnosis not present

## 2020-11-27 DIAGNOSIS — H2512 Age-related nuclear cataract, left eye: Secondary | ICD-10-CM | POA: Diagnosis not present

## 2020-11-28 DIAGNOSIS — H2511 Age-related nuclear cataract, right eye: Secondary | ICD-10-CM | POA: Diagnosis not present

## 2020-11-28 DIAGNOSIS — H25011 Cortical age-related cataract, right eye: Secondary | ICD-10-CM | POA: Diagnosis not present

## 2020-11-28 DIAGNOSIS — H25041 Posterior subcapsular polar age-related cataract, right eye: Secondary | ICD-10-CM | POA: Diagnosis not present

## 2020-12-11 DIAGNOSIS — H2511 Age-related nuclear cataract, right eye: Secondary | ICD-10-CM | POA: Diagnosis not present

## 2021-02-09 DIAGNOSIS — H1032 Unspecified acute conjunctivitis, left eye: Secondary | ICD-10-CM | POA: Diagnosis not present

## 2021-02-09 DIAGNOSIS — J0140 Acute pansinusitis, unspecified: Secondary | ICD-10-CM | POA: Diagnosis not present

## 2021-02-28 IMAGING — RF DG C-ARM 1-60 MIN
1 series · 3 of 3 positions shown · non-contrast
Comparison: CT 01/11/2019.

CLINICAL DATA: Total right ankle arthroplasty.

EXAM:
DG C-ARM 1-60 MIN
FLUOROSCOPY TIME:  Fluoroscopy Time:  1 minutes 1 second
Number of Acquired Spot Images: 3

[Series 1: run · 3 of 3 slices shown]
[im 1/3]
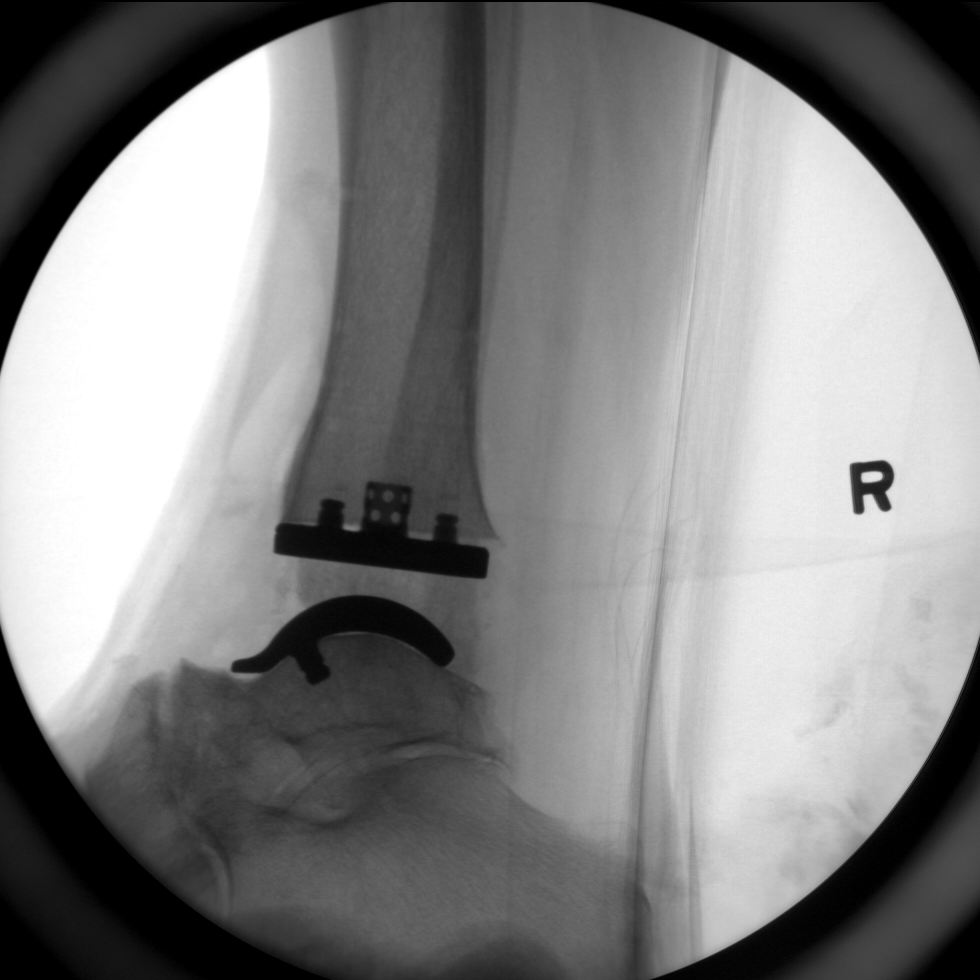
[im 2/3]
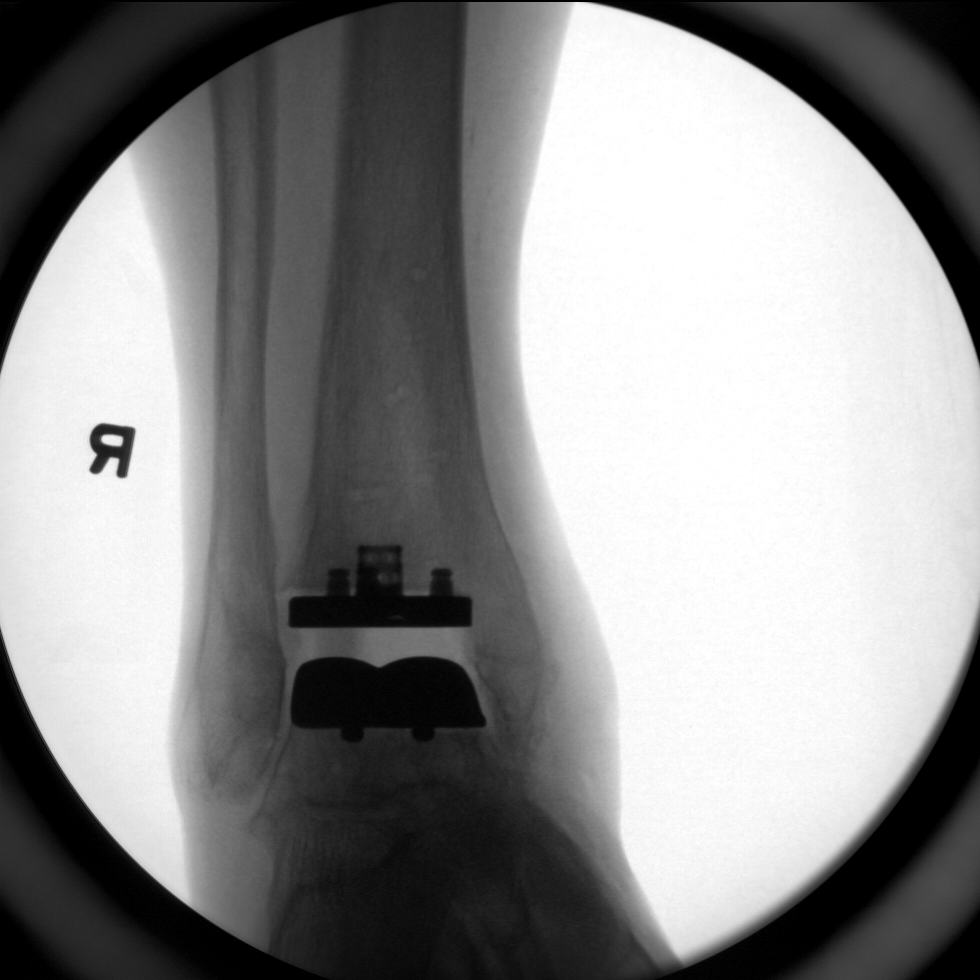
[im 3/3]
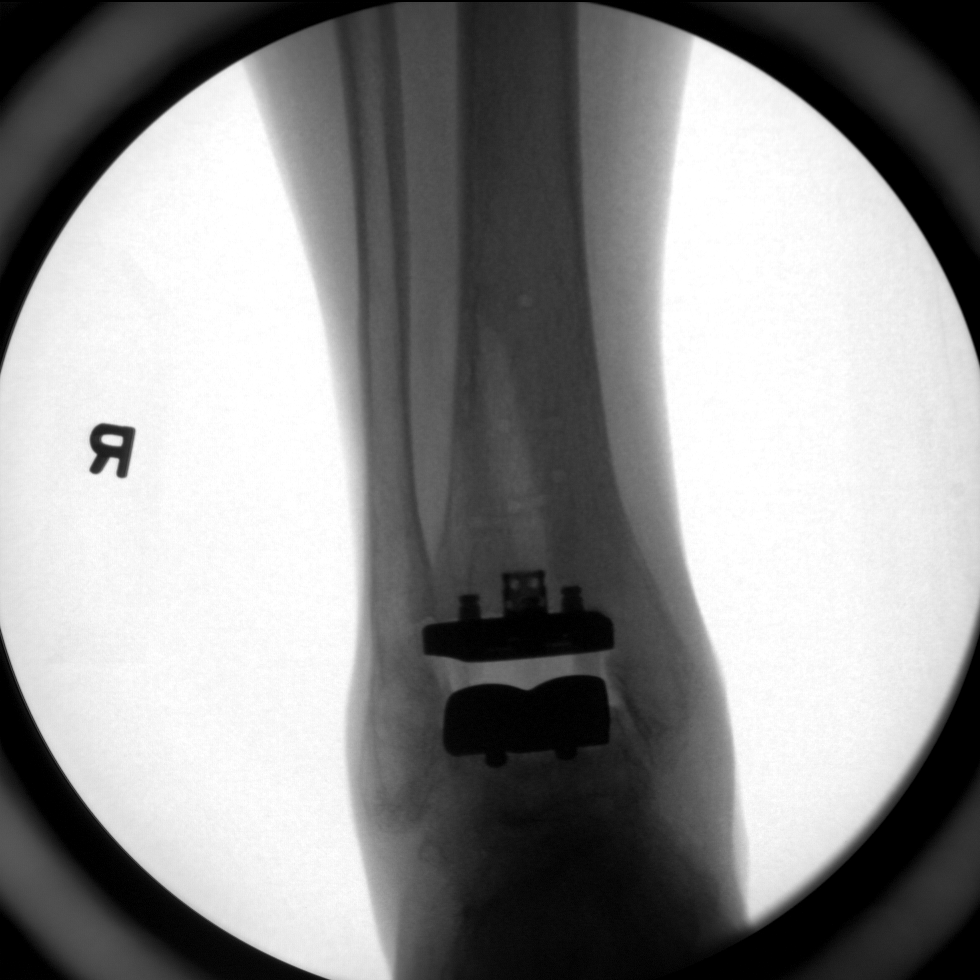

[3 of 3 positions shown; findings below may reference images not displayed]

FINDINGS: Total right ankle arthroplasty with hardware intact. Anatomic
alignment. No acute bony abnormality.
IMPRESSION: Total right ankle arthroplasty with hardware intact and anatomic
alignment.

## 2021-02-28 IMAGING — RF DG FOOT 2V*R*
1 series · 2 of 2 positions shown · non-contrast
Comparison: CT 01/11/2019.

CLINICAL DATA: Right talonavicular joint arthrodesis.

EXAM:
RIGHT FOOT - 2 VIEW

[Series 1: run · 2 of 2 slices shown]
[im 1/2]
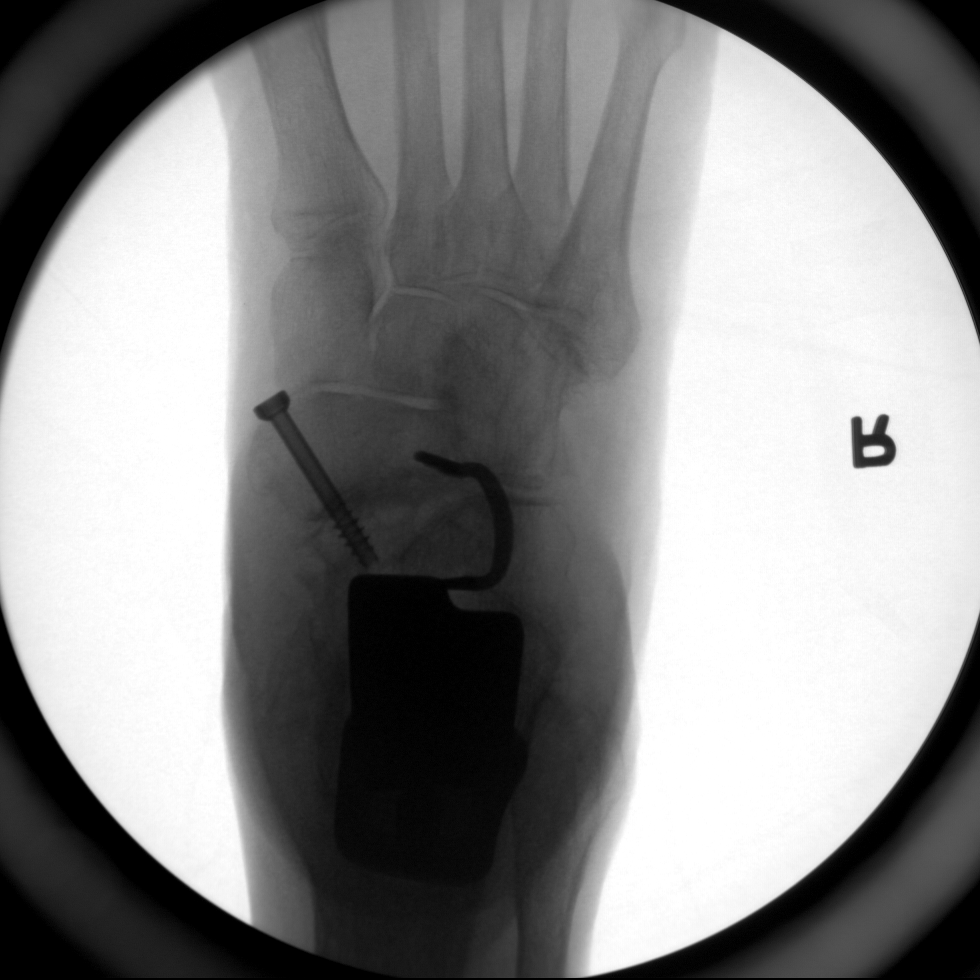
[im 2/2]
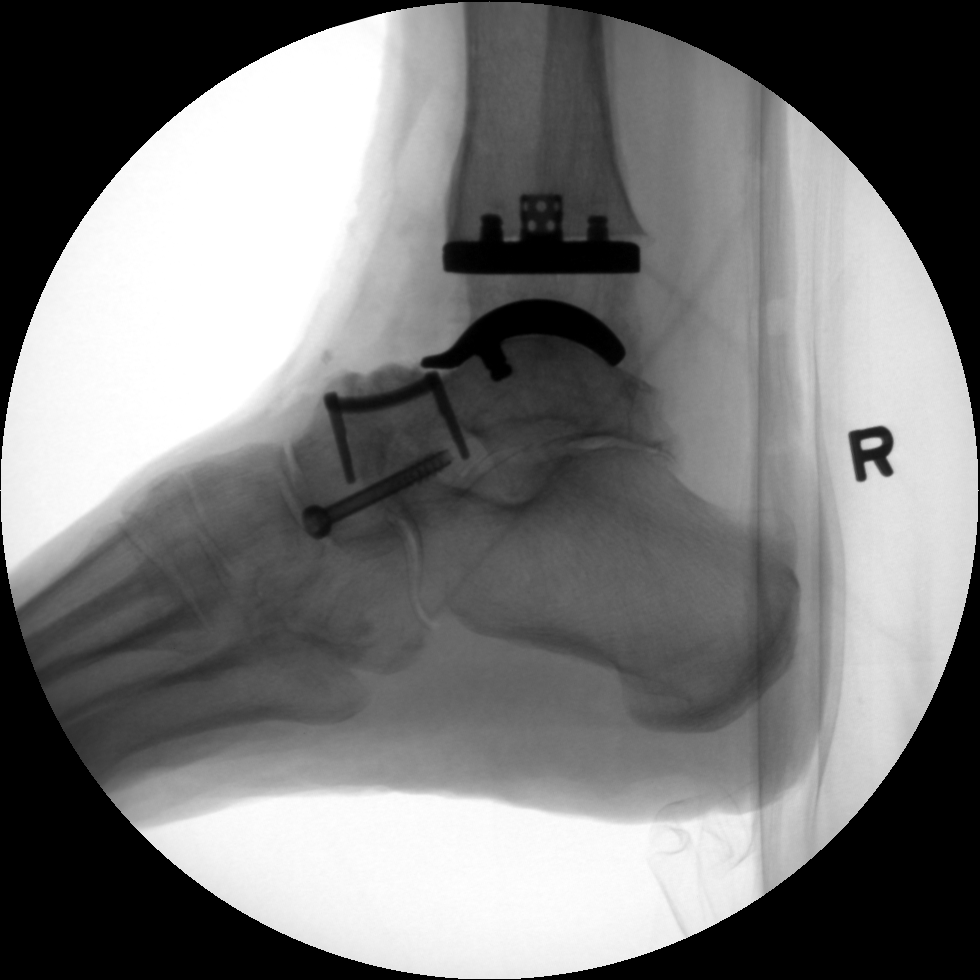

[2 of 2 positions shown; findings below may reference images not displayed]

FINDINGS: Prior total right ankle arthroplasty. Plate and screw fixation noted
about the forefoot. Hardware intact. Anatomic alignment.
IMPRESSION: Postsurgical changes right ankle and foot. Hardware intact. Anatomic
alignment.

## 2021-05-11 DIAGNOSIS — Z96661 Presence of right artificial ankle joint: Secondary | ICD-10-CM | POA: Diagnosis not present

## 2021-05-11 DIAGNOSIS — M79671 Pain in right foot: Secondary | ICD-10-CM | POA: Diagnosis not present

## 2021-06-25 DIAGNOSIS — K219 Gastro-esophageal reflux disease without esophagitis: Secondary | ICD-10-CM | POA: Diagnosis not present

## 2021-06-25 DIAGNOSIS — N183 Chronic kidney disease, stage 3 unspecified: Secondary | ICD-10-CM | POA: Diagnosis not present

## 2021-06-25 DIAGNOSIS — E782 Mixed hyperlipidemia: Secondary | ICD-10-CM | POA: Diagnosis not present

## 2021-06-25 DIAGNOSIS — I10 Essential (primary) hypertension: Secondary | ICD-10-CM | POA: Diagnosis not present

## 2021-06-25 DIAGNOSIS — E1169 Type 2 diabetes mellitus with other specified complication: Secondary | ICD-10-CM | POA: Diagnosis not present

## 2021-08-04 DIAGNOSIS — R600 Localized edema: Secondary | ICD-10-CM | POA: Diagnosis not present

## 2021-08-04 DIAGNOSIS — M79661 Pain in right lower leg: Secondary | ICD-10-CM | POA: Diagnosis not present

## 2021-08-04 DIAGNOSIS — I872 Venous insufficiency (chronic) (peripheral): Secondary | ICD-10-CM | POA: Diagnosis not present

## 2021-08-04 DIAGNOSIS — M79662 Pain in left lower leg: Secondary | ICD-10-CM | POA: Diagnosis not present

## 2021-08-18 DIAGNOSIS — I83813 Varicose veins of bilateral lower extremities with pain: Secondary | ICD-10-CM | POA: Diagnosis not present

## 2021-08-18 DIAGNOSIS — I10 Essential (primary) hypertension: Secondary | ICD-10-CM | POA: Diagnosis not present

## 2021-08-18 DIAGNOSIS — I872 Venous insufficiency (chronic) (peripheral): Secondary | ICD-10-CM | POA: Diagnosis not present

## 2021-08-18 DIAGNOSIS — R600 Localized edema: Secondary | ICD-10-CM | POA: Diagnosis not present

## 2021-08-18 DIAGNOSIS — M7989 Other specified soft tissue disorders: Secondary | ICD-10-CM | POA: Diagnosis not present

## 2021-08-18 DIAGNOSIS — M2520 Flail joint, unspecified joint: Secondary | ICD-10-CM | POA: Diagnosis not present

## 2021-09-15 DIAGNOSIS — Z1231 Encounter for screening mammogram for malignant neoplasm of breast: Secondary | ICD-10-CM | POA: Diagnosis not present

## 2021-10-28 DIAGNOSIS — M79644 Pain in right finger(s): Secondary | ICD-10-CM | POA: Diagnosis not present

## 2021-11-10 DIAGNOSIS — H02831 Dermatochalasis of right upper eyelid: Secondary | ICD-10-CM | POA: Diagnosis not present

## 2021-11-10 DIAGNOSIS — H02834 Dermatochalasis of left upper eyelid: Secondary | ICD-10-CM | POA: Diagnosis not present

## 2021-11-10 DIAGNOSIS — H04203 Unspecified epiphora, bilateral lacrimal glands: Secondary | ICD-10-CM | POA: Diagnosis not present

## 2021-11-18 DIAGNOSIS — H02105 Unspecified ectropion of left lower eyelid: Secondary | ICD-10-CM | POA: Diagnosis not present

## 2021-11-18 DIAGNOSIS — H02135 Senile ectropion of left lower eyelid: Secondary | ICD-10-CM | POA: Diagnosis not present

## 2021-11-18 DIAGNOSIS — H02132 Senile ectropion of right lower eyelid: Secondary | ICD-10-CM | POA: Diagnosis not present

## 2021-11-18 DIAGNOSIS — H02102 Unspecified ectropion of right lower eyelid: Secondary | ICD-10-CM | POA: Diagnosis not present

## 2022-01-05 DIAGNOSIS — N183 Chronic kidney disease, stage 3 unspecified: Secondary | ICD-10-CM | POA: Diagnosis not present

## 2022-01-05 DIAGNOSIS — E782 Mixed hyperlipidemia: Secondary | ICD-10-CM | POA: Diagnosis not present

## 2022-01-05 DIAGNOSIS — M150 Primary generalized (osteo)arthritis: Secondary | ICD-10-CM | POA: Diagnosis not present

## 2022-01-05 DIAGNOSIS — R300 Dysuria: Secondary | ICD-10-CM | POA: Diagnosis not present

## 2022-01-05 DIAGNOSIS — I10 Essential (primary) hypertension: Secondary | ICD-10-CM | POA: Diagnosis not present

## 2022-01-05 DIAGNOSIS — K219 Gastro-esophageal reflux disease without esophagitis: Secondary | ICD-10-CM | POA: Diagnosis not present

## 2022-01-05 DIAGNOSIS — E1169 Type 2 diabetes mellitus with other specified complication: Secondary | ICD-10-CM | POA: Diagnosis not present

## 2022-01-05 DIAGNOSIS — R5383 Other fatigue: Secondary | ICD-10-CM | POA: Diagnosis not present

## 2022-02-17 DIAGNOSIS — H02831 Dermatochalasis of right upper eyelid: Secondary | ICD-10-CM | POA: Diagnosis not present

## 2022-02-17 DIAGNOSIS — H02403 Unspecified ptosis of bilateral eyelids: Secondary | ICD-10-CM | POA: Diagnosis not present

## 2022-02-17 DIAGNOSIS — H02834 Dermatochalasis of left upper eyelid: Secondary | ICD-10-CM | POA: Diagnosis not present

## 2022-06-07 DIAGNOSIS — Z96661 Presence of right artificial ankle joint: Secondary | ICD-10-CM | POA: Diagnosis not present

## 2022-06-21 DIAGNOSIS — M7701 Medial epicondylitis, right elbow: Secondary | ICD-10-CM | POA: Diagnosis not present

## 2022-06-30 DIAGNOSIS — G932 Benign intracranial hypertension: Secondary | ICD-10-CM | POA: Diagnosis not present

## 2022-07-06 DIAGNOSIS — K219 Gastro-esophageal reflux disease without esophagitis: Secondary | ICD-10-CM | POA: Diagnosis not present

## 2022-07-06 DIAGNOSIS — I10 Essential (primary) hypertension: Secondary | ICD-10-CM | POA: Diagnosis not present

## 2022-07-06 DIAGNOSIS — M150 Primary generalized (osteo)arthritis: Secondary | ICD-10-CM | POA: Diagnosis not present

## 2022-07-06 DIAGNOSIS — E782 Mixed hyperlipidemia: Secondary | ICD-10-CM | POA: Diagnosis not present

## 2022-07-06 DIAGNOSIS — E119 Type 2 diabetes mellitus without complications: Secondary | ICD-10-CM | POA: Diagnosis not present

## 2022-09-08 DIAGNOSIS — H53143 Visual discomfort, bilateral: Secondary | ICD-10-CM | POA: Diagnosis not present

## 2022-09-08 DIAGNOSIS — Z9849 Cataract extraction status, unspecified eye: Secondary | ICD-10-CM | POA: Diagnosis not present

## 2022-09-08 DIAGNOSIS — E119 Type 2 diabetes mellitus without complications: Secondary | ICD-10-CM | POA: Diagnosis not present

## 2022-09-08 DIAGNOSIS — Z961 Presence of intraocular lens: Secondary | ICD-10-CM | POA: Diagnosis not present

## 2022-09-08 DIAGNOSIS — H52223 Regular astigmatism, bilateral: Secondary | ICD-10-CM | POA: Diagnosis not present

## 2022-09-08 DIAGNOSIS — H524 Presbyopia: Secondary | ICD-10-CM | POA: Diagnosis not present

## 2022-09-08 DIAGNOSIS — H1789 Other corneal scars and opacities: Secondary | ICD-10-CM | POA: Diagnosis not present

## 2022-09-21 DIAGNOSIS — Z1231 Encounter for screening mammogram for malignant neoplasm of breast: Secondary | ICD-10-CM | POA: Diagnosis not present

## 2022-10-14 DIAGNOSIS — L820 Inflamed seborrheic keratosis: Secondary | ICD-10-CM | POA: Diagnosis not present

## 2022-10-14 DIAGNOSIS — D485 Neoplasm of uncertain behavior of skin: Secondary | ICD-10-CM | POA: Diagnosis not present

## 2022-12-16 DIAGNOSIS — M25552 Pain in left hip: Secondary | ICD-10-CM | POA: Diagnosis not present

## 2022-12-16 DIAGNOSIS — M5451 Vertebrogenic low back pain: Secondary | ICD-10-CM | POA: Diagnosis not present

## 2022-12-17 ENCOUNTER — Ambulatory Visit
Admission: RE | Admit: 2022-12-17 | Discharge: 2022-12-17 | Disposition: A | Discharge status: Home / Self Care | Payer: Medicare Other | Source: Ambulatory Visit | Attending: Medical

## 2022-12-17 ENCOUNTER — Other Ambulatory Visit: Payer: Self-pay | Admitting: Medical

## 2022-12-17 DIAGNOSIS — Z981 Arthrodesis status: Secondary | ICD-10-CM | POA: Diagnosis not present

## 2022-12-17 DIAGNOSIS — M47816 Spondylosis without myelopathy or radiculopathy, lumbar region: Secondary | ICD-10-CM | POA: Diagnosis not present

## 2022-12-17 DIAGNOSIS — M4316 Spondylolisthesis, lumbar region: Secondary | ICD-10-CM | POA: Diagnosis not present

## 2022-12-17 DIAGNOSIS — M5451 Vertebrogenic low back pain: Secondary | ICD-10-CM

## 2022-12-21 DIAGNOSIS — M25552 Pain in left hip: Secondary | ICD-10-CM | POA: Diagnosis not present

## 2022-12-21 DIAGNOSIS — M259 Joint disorder, unspecified: Secondary | ICD-10-CM | POA: Diagnosis not present

## 2022-12-21 DIAGNOSIS — M545 Low back pain, unspecified: Secondary | ICD-10-CM | POA: Diagnosis not present

## 2022-12-23 DIAGNOSIS — M533 Sacrococcygeal disorders, not elsewhere classified: Secondary | ICD-10-CM | POA: Diagnosis not present

## 2023-01-11 DIAGNOSIS — E782 Mixed hyperlipidemia: Secondary | ICD-10-CM | POA: Diagnosis not present

## 2023-01-11 DIAGNOSIS — J40 Bronchitis, not specified as acute or chronic: Secondary | ICD-10-CM | POA: Diagnosis not present

## 2023-01-11 DIAGNOSIS — E119 Type 2 diabetes mellitus without complications: Secondary | ICD-10-CM | POA: Diagnosis not present

## 2023-01-11 DIAGNOSIS — N390 Urinary tract infection, site not specified: Secondary | ICD-10-CM | POA: Diagnosis not present

## 2023-01-11 DIAGNOSIS — M150 Primary generalized (osteo)arthritis: Secondary | ICD-10-CM | POA: Diagnosis not present

## 2023-01-11 DIAGNOSIS — K219 Gastro-esophageal reflux disease without esophagitis: Secondary | ICD-10-CM | POA: Diagnosis not present

## 2023-01-11 DIAGNOSIS — I10 Essential (primary) hypertension: Secondary | ICD-10-CM | POA: Diagnosis not present

## 2023-02-16 DIAGNOSIS — E11319 Type 2 diabetes mellitus with unspecified diabetic retinopathy without macular edema: Secondary | ICD-10-CM | POA: Diagnosis not present

## 2023-03-15 DIAGNOSIS — M542 Cervicalgia: Secondary | ICD-10-CM | POA: Diagnosis not present

## 2023-03-15 DIAGNOSIS — M25511 Pain in right shoulder: Secondary | ICD-10-CM | POA: Diagnosis not present

## 2023-03-24 DIAGNOSIS — R300 Dysuria: Secondary | ICD-10-CM | POA: Diagnosis not present

## 2023-04-05 DIAGNOSIS — M542 Cervicalgia: Secondary | ICD-10-CM | POA: Diagnosis not present

## 2023-04-20 DIAGNOSIS — M542 Cervicalgia: Secondary | ICD-10-CM | POA: Diagnosis not present

## 2023-04-29 DIAGNOSIS — M542 Cervicalgia: Secondary | ICD-10-CM | POA: Diagnosis not present

## 2023-05-03 DIAGNOSIS — M26622 Arthralgia of left temporomandibular joint: Secondary | ICD-10-CM | POA: Diagnosis not present

## 2023-05-03 DIAGNOSIS — M533 Sacrococcygeal disorders, not elsewhere classified: Secondary | ICD-10-CM | POA: Diagnosis not present

## 2023-05-04 DIAGNOSIS — M542 Cervicalgia: Secondary | ICD-10-CM | POA: Diagnosis not present

## 2023-05-12 DIAGNOSIS — S134XXA Sprain of ligaments of cervical spine, initial encounter: Secondary | ICD-10-CM | POA: Diagnosis not present

## 2023-05-12 DIAGNOSIS — M533 Sacrococcygeal disorders, not elsewhere classified: Secondary | ICD-10-CM | POA: Diagnosis not present

## 2023-05-12 DIAGNOSIS — M542 Cervicalgia: Secondary | ICD-10-CM | POA: Diagnosis not present

## 2023-05-31 DIAGNOSIS — T162XXA Foreign body in left ear, initial encounter: Secondary | ICD-10-CM | POA: Diagnosis not present

## 2023-06-02 DIAGNOSIS — H00025 Hordeolum internum left lower eyelid: Secondary | ICD-10-CM | POA: Diagnosis not present

## 2023-06-06 DIAGNOSIS — M7918 Myalgia, other site: Secondary | ICD-10-CM | POA: Diagnosis not present

## 2023-06-06 DIAGNOSIS — M542 Cervicalgia: Secondary | ICD-10-CM | POA: Diagnosis not present

## 2023-07-13 DIAGNOSIS — E119 Type 2 diabetes mellitus without complications: Secondary | ICD-10-CM | POA: Diagnosis not present

## 2023-07-13 DIAGNOSIS — I10 Essential (primary) hypertension: Secondary | ICD-10-CM | POA: Diagnosis not present

## 2023-07-13 DIAGNOSIS — M150 Primary generalized (osteo)arthritis: Secondary | ICD-10-CM | POA: Diagnosis not present

## 2023-07-13 DIAGNOSIS — K219 Gastro-esophageal reflux disease without esophagitis: Secondary | ICD-10-CM | POA: Diagnosis not present

## 2023-07-13 DIAGNOSIS — Z0000 Encounter for general adult medical examination without abnormal findings: Secondary | ICD-10-CM | POA: Diagnosis not present

## 2023-07-13 DIAGNOSIS — E782 Mixed hyperlipidemia: Secondary | ICD-10-CM | POA: Diagnosis not present

## 2023-07-13 DIAGNOSIS — D538 Other specified nutritional anemias: Secondary | ICD-10-CM | POA: Diagnosis not present

## 2023-07-13 DIAGNOSIS — L237 Allergic contact dermatitis due to plants, except food: Secondary | ICD-10-CM | POA: Diagnosis not present

## 2023-07-13 DIAGNOSIS — R42 Dizziness and giddiness: Secondary | ICD-10-CM | POA: Diagnosis not present

## 2023-07-21 DIAGNOSIS — M5412 Radiculopathy, cervical region: Secondary | ICD-10-CM | POA: Diagnosis not present

## 2023-07-26 DIAGNOSIS — M5031 Other cervical disc degeneration,  high cervical region: Secondary | ICD-10-CM | POA: Diagnosis not present

## 2023-07-26 DIAGNOSIS — S134XXA Sprain of ligaments of cervical spine, initial encounter: Secondary | ICD-10-CM | POA: Diagnosis not present

## 2023-07-26 DIAGNOSIS — M4802 Spinal stenosis, cervical region: Secondary | ICD-10-CM | POA: Diagnosis not present

## 2023-07-26 DIAGNOSIS — Z981 Arthrodesis status: Secondary | ICD-10-CM | POA: Diagnosis not present

## 2023-07-26 DIAGNOSIS — M5412 Radiculopathy, cervical region: Secondary | ICD-10-CM | POA: Diagnosis not present

## 2023-07-27 DIAGNOSIS — Z471 Aftercare following joint replacement surgery: Secondary | ICD-10-CM | POA: Diagnosis not present

## 2023-07-27 DIAGNOSIS — Z96661 Presence of right artificial ankle joint: Secondary | ICD-10-CM | POA: Diagnosis not present

## 2023-08-17 DIAGNOSIS — M542 Cervicalgia: Secondary | ICD-10-CM | POA: Diagnosis not present

## 2023-08-24 DIAGNOSIS — M542 Cervicalgia: Secondary | ICD-10-CM | POA: Diagnosis not present

## 2023-08-26 DIAGNOSIS — M545 Low back pain, unspecified: Secondary | ICD-10-CM | POA: Diagnosis not present

## 2023-09-27 DIAGNOSIS — Z1231 Encounter for screening mammogram for malignant neoplasm of breast: Secondary | ICD-10-CM | POA: Diagnosis not present

## 2023-11-12 DIAGNOSIS — M5459 Other low back pain: Secondary | ICD-10-CM | POA: Diagnosis not present

## 2023-11-16 DIAGNOSIS — M47816 Spondylosis without myelopathy or radiculopathy, lumbar region: Secondary | ICD-10-CM | POA: Diagnosis not present

## 2023-11-17 DIAGNOSIS — N390 Urinary tract infection, site not specified: Secondary | ICD-10-CM | POA: Diagnosis not present
